# Patient Record
Sex: Male | Born: 1935
Health system: Southern US, Community
[De-identification: ages and names within clinical notes are randomized; demographics above are authoritative.]

## PROBLEM LIST (undated history)

## (undated) DIAGNOSIS — Z9889 Other specified postprocedural states: Secondary | ICD-10-CM

## (undated) DIAGNOSIS — K219 Gastro-esophageal reflux disease without esophagitis: Secondary | ICD-10-CM

## (undated) DIAGNOSIS — E119 Type 2 diabetes mellitus without complications: Secondary | ICD-10-CM

## (undated) DIAGNOSIS — E785 Hyperlipidemia, unspecified: Secondary | ICD-10-CM

## (undated) DIAGNOSIS — R011 Cardiac murmur, unspecified: Secondary | ICD-10-CM

## (undated) DIAGNOSIS — I10 Essential (primary) hypertension: Secondary | ICD-10-CM

## (undated) DIAGNOSIS — Z950 Presence of cardiac pacemaker: Secondary | ICD-10-CM

## (undated) DIAGNOSIS — Z8679 Personal history of other diseases of the circulatory system: Secondary | ICD-10-CM

## (undated) DIAGNOSIS — B009 Herpesviral infection, unspecified: Secondary | ICD-10-CM

## (undated) DIAGNOSIS — I442 Atrioventricular block, complete: Secondary | ICD-10-CM

## (undated) HISTORY — DX: Other specified postprocedural states: Z98.890

## (undated) HISTORY — DX: Herpesviral infection, unspecified: B00.9

## (undated) HISTORY — DX: Type 2 diabetes mellitus without complications: E11.9

## (undated) HISTORY — PX: APPENDECTOMY: SHX54

## (undated) HISTORY — DX: Hyperlipidemia, unspecified: E78.5

## (undated) HISTORY — DX: Personal history of other diseases of the circulatory system: Z86.79

## (undated) HISTORY — DX: Essential (primary) hypertension: I10

---

## 2001-07-09 ENCOUNTER — Encounter: Payer: Self-pay | Admitting: Emergency Medicine

## 2001-07-09 ENCOUNTER — Emergency Department (HOSPITAL_COMMUNITY): Admission: EM | Admit: 2001-07-09 | Discharge: 2001-07-09 | Payer: Self-pay | Admitting: Emergency Medicine

## 2001-07-27 ENCOUNTER — Encounter: Admission: RE | Admit: 2001-07-27 | Discharge: 2001-10-25 | Payer: Self-pay | Admitting: Internal Medicine

## 2002-07-31 ENCOUNTER — Emergency Department (HOSPITAL_COMMUNITY): Admission: EM | Admit: 2002-07-31 | Discharge: 2002-07-31 | Payer: Self-pay | Admitting: Emergency Medicine

## 2002-08-12 ENCOUNTER — Encounter: Payer: Self-pay | Admitting: Internal Medicine

## 2002-08-12 ENCOUNTER — Encounter: Admission: RE | Admit: 2002-08-12 | Discharge: 2002-08-12 | Payer: Self-pay | Admitting: Internal Medicine

## 2002-09-27 ENCOUNTER — Emergency Department (HOSPITAL_COMMUNITY): Admission: EM | Admit: 2002-09-27 | Discharge: 2002-09-28 | Payer: Self-pay | Admitting: Emergency Medicine

## 2004-07-09 ENCOUNTER — Emergency Department (HOSPITAL_COMMUNITY): Admission: EM | Admit: 2004-07-09 | Discharge: 2004-07-09 | Payer: Self-pay | Admitting: Emergency Medicine

## 2004-07-30 ENCOUNTER — Encounter: Admission: RE | Admit: 2004-07-30 | Discharge: 2004-10-28 | Payer: Self-pay | Admitting: Endocrinology

## 2006-10-14 ENCOUNTER — Encounter: Admission: RE | Admit: 2006-10-14 | Discharge: 2006-10-14 | Payer: Self-pay | Admitting: Cardiothoracic Surgery

## 2006-10-14 ENCOUNTER — Ambulatory Visit: Payer: Self-pay | Admitting: Cardiothoracic Surgery

## 2007-02-10 ENCOUNTER — Encounter: Payer: Self-pay | Admitting: Infectious Disease

## 2007-03-25 ENCOUNTER — Ambulatory Visit: Payer: Self-pay | Admitting: Infectious Disease

## 2007-03-25 DIAGNOSIS — B009 Herpesviral infection, unspecified: Secondary | ICD-10-CM | POA: Insufficient documentation

## 2007-03-25 LAB — CONVERTED CEMR LAB: GC Probe Amp, Urine: NEGATIVE

## 2007-11-06 ENCOUNTER — Ambulatory Visit: Payer: Self-pay | Admitting: Cardiothoracic Surgery

## 2007-11-06 ENCOUNTER — Encounter: Admission: RE | Admit: 2007-11-06 | Discharge: 2007-11-06 | Payer: Self-pay | Admitting: Cardiothoracic Surgery

## 2008-11-11 ENCOUNTER — Encounter: Admission: RE | Admit: 2008-11-11 | Discharge: 2008-11-11 | Payer: Self-pay | Admitting: Cardiothoracic Surgery

## 2008-11-11 ENCOUNTER — Ambulatory Visit: Payer: Self-pay | Admitting: Cardiothoracic Surgery

## 2008-12-20 ENCOUNTER — Ambulatory Visit (HOSPITAL_COMMUNITY): Admission: RE | Admit: 2008-12-20 | Discharge: 2008-12-20 | Payer: Self-pay | Admitting: *Deleted

## 2009-04-29 ENCOUNTER — Emergency Department (HOSPITAL_COMMUNITY): Admission: EM | Admit: 2009-04-29 | Discharge: 2009-04-29 | Payer: Self-pay | Admitting: Emergency Medicine

## 2009-06-09 ENCOUNTER — Encounter: Admission: RE | Admit: 2009-06-09 | Discharge: 2009-06-09 | Payer: Self-pay | Admitting: Endocrinology

## 2009-09-05 ENCOUNTER — Ambulatory Visit (HOSPITAL_COMMUNITY): Admission: RE | Admit: 2009-09-05 | Discharge: 2009-09-05 | Payer: Self-pay | Admitting: Gastroenterology

## 2009-11-23 ENCOUNTER — Emergency Department (HOSPITAL_COMMUNITY): Admission: EM | Admit: 2009-11-23 | Discharge: 2009-11-23 | Payer: Self-pay | Admitting: Emergency Medicine

## 2009-12-18 ENCOUNTER — Ambulatory Visit: Payer: Self-pay | Admitting: Surgery

## 2009-12-18 ENCOUNTER — Encounter (INDEPENDENT_AMBULATORY_CARE_PROVIDER_SITE_OTHER): Payer: Self-pay | Admitting: Emergency Medicine

## 2009-12-18 ENCOUNTER — Emergency Department (HOSPITAL_COMMUNITY): Admission: EM | Admit: 2009-12-18 | Discharge: 2009-12-18 | Payer: Self-pay | Admitting: Emergency Medicine

## 2010-05-09 ENCOUNTER — Ambulatory Visit: Payer: Self-pay | Admitting: Cardiothoracic Surgery

## 2010-05-09 ENCOUNTER — Encounter
Admission: RE | Admit: 2010-05-09 | Discharge: 2010-05-09 | Payer: Self-pay | Source: Home / Self Care | Attending: Cardiothoracic Surgery | Admitting: Cardiothoracic Surgery

## 2010-08-11 LAB — BASIC METABOLIC PANEL
BUN: 19 mg/dL (ref 6–23)
Creatinine, Ser: 1.47 mg/dL (ref 0.4–1.5)
GFR calc non Af Amer: 47 mL/min — ABNORMAL LOW (ref >60)

## 2010-08-11 LAB — CBC
MCHC: 33.9 g/dL (ref 30.0–36.0)
RDW: 15.9 % — ABNORMAL HIGH (ref 11.5–15.5)

## 2010-08-11 LAB — DIFFERENTIAL
Basophils Absolute: 0.1 10*3/uL (ref 0.0–0.1)
Basophils Relative: 1 % (ref 0–1)
Monocytes Absolute: 1 10*3/uL (ref 0.1–1.0)
Neutro Abs: 7 10*3/uL (ref 1.7–7.7)
Neutrophils Relative %: 73 % (ref 43–77)

## 2010-08-28 LAB — COMPREHENSIVE METABOLIC PANEL
CO2: 25 mEq/L (ref 19–32)
Calcium: 8.8 mg/dL (ref 8.4–10.5)
Creatinine, Ser: 1.47 mg/dL (ref 0.4–1.5)
GFR calc non Af Amer: 47 mL/min — ABNORMAL LOW (ref >60)
Glucose, Bld: 119 mg/dL — ABNORMAL HIGH (ref 70–99)
Total Protein: 6.1 g/dL (ref 6.0–8.3)

## 2010-08-28 LAB — CBC
Hemoglobin: 13.7 g/dL (ref 13.0–17.0)
Platelets: 167 10*3/uL (ref 150–400)

## 2010-08-28 LAB — DIFFERENTIAL
Basophils Absolute: 0 10*3/uL (ref 0.0–0.1)
Lymphocytes Relative: 23 % (ref 12–46)
Lymphs Abs: 1.3 10*3/uL (ref 0.7–4.0)
Neutro Abs: 3.8 10*3/uL (ref 1.7–7.7)

## 2010-08-28 LAB — LIPASE, BLOOD: Lipase: 21 U/L (ref 11–59)

## 2010-09-02 LAB — GLUCOSE, CAPILLARY: Glucose-Capillary: 90 mg/dL (ref 70–99)

## 2010-10-09 NOTE — Assessment & Plan Note (Signed)
OFFICE VISIT   Mercado, Travis A  DOB:  [birth year]                                        May 09, 2010  CHART #:  [id number]   CURRENT PROBLEMS:  1. 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  2. Hypertension.  3. Type 2 diabetes.   PRESENT ILLNESS:  The patient is a 75 year old obese African American  male, diabetic, who has been followed for mild fusiform aneurysm of the  ascending thoracic aorta since 2007.  He has hypertension, dyslipidemia,  and is a nonsmoker.  He denies any chest pains or back pain or symptoms  of CHF.  He has a very soft grade 1/6 systolic murmur, which has also  been stable, for which he has not been able to obtain a 2-D echo.  There  is no family history of significant thoracic or abdominal aortic  aneurysm disease.  He remains obese with a weight of 221 pounds, which  is stable.   CURRENT MEDICATIONS:  1. Benicar 40 mg p.o. daily.  2. Actos 40 mg daily.  3. Atenolol 25 mg daily.  4. Simvastatin 40 mg daily.  5. Vitamin D.   He is currently taking oral prednisone prescribed by a dermatologist for  a variant of psoriasis.  He states he stopped taking 81 aspirin a day  due to some indigestion.   PHYSICAL EXAMINATION:  Vital Signs:  Blood pressure 134/80, pulse 60,  respirations 16, and oxygen saturation 97%.  Weight 221 pounds by the  patient's report.  General Appearance:  A pleasant 75 year old male, in  no distress.  He is obese.  Lungs:  Breath sounds are clear.  He has no  carotid bruit.  Cardiac:  Rhythm is regular.  He has a very soft grade  1/6 systolic murmur consistent with aortic sclerosis.  Abdomen:  He has  no pulsatile mass in the abdomen.  Extremities:  Peripheral pulses are  intact in all extremities.   LABORATORY DATA:  An MRA of his thoracic aorta was performed instead of  a CT scan in order to reduce long-term exposure to radiation.  This  shows  a stable mild fusiform aneurysm of the ascending aorta without  change, measuring by MRA of 4.5 cm.  This represents low risk disease  and surgical reconstruction cannot be recommended unless became in the  5.5 range or shows rapid increase over time.   PLAN:  I would recommend a followup screening MRA of his thoracic aorta  in 1 year.  He has been unable to get a 2-D echo for a slight murmur,  but we will attempt to arrange this at the hospital when he returns for  his followup MRA next year.   Travis Mercado, M.D.  Electronically Signed   PV/MEDQ  D:  05/09/2010  T:  05/09/2010  Job:  [id number]   cc:   Travis Mercado, M.D.

## 2010-10-09 NOTE — Assessment & Plan Note (Signed)
OFFICE VISIT   [name], Jadyn A  DOB:  08/30/1935                                        November 07, 2007  CHART #:  [id number]   CURRENT PROBLEMS:  1. Fusiform ascending aortic aneurysm measuring 4.3 cm, initial      diagnosis July 2007.  2. Murmur of aortic stenosis or bicuspid aortic valve.  3. Hypertension.  4. Type 2 diabetes mellitus.  5. Hyperlipidemia.   HISTORY OF PRESENT ILLNESS:  The patient returns for his annual  evaluation of his thoracic CT angiogram to follow the fusiform aneurysm  of his ascending aorta which was initially noted in 2007.  He was last  evaluated 1 year ago, and the CT scan today shows the aneurysm remains  stable at 4.3 cm.  He has had no associated chest pains or shortness of  breath or decrease in exercise tolerance.  He still does aerobic  exercise 2-3 days a week at the [facility].  He does not smoke and  he is followed by Dr. Juleen China.  He remains on atenolol 50 mg, aspirin 81  mg, Actos 45 mg, Benicar 40 mg, Zocor 40 mg daily.  On his CT scan  today, his arch and descending thoracic aorta remain normal at  approximately 3 cm in diameter.  He has no pulmonary or parenchymal  masses noted on his lung windows.   PHYSICAL EXAMINATION:  Blood pressure 150/90, pulse 50, respirations 20,  saturation 98%.  His weight 229 pounds.  He is alert and pleasant.  Breath sounds are clear and equal.  He has a grade 2/6 systolic ejection  murmur at the right upper sternal border.  Peripheral pulses are intact.   IMPRESSION AND PLAN:  The patient has a mild stable fusiform ascending  aneurysm and probably a bicuspid aortic valve with aortic sclerosis or  mild stenosis.  He states he has not had a 2-D echo in several years and  we will arrange for an evaluation with echo by Dr. Aggie Cosier at  [facility].  I discussed with the patient that  intervention on his fusiform aneurysm would not be indicated until it  becomes  in the 5-5.5 cm range.  However, I do think it would be prudent  to proceed with a 2-D echo to evaluate more accurately his aortic valve.  Otherwise, return to see me in 1 year with a followup CT angiogram of  the chest.   Kerin Perna, M.D.  Electronically Signed   PV/MEDQ  D:  11/07/2007  T:  11/07/2007  Job:  [id number]   cc:   Brooke Bonito, M.D.

## 2010-10-09 NOTE — Assessment & Plan Note (Signed)
OFFICE VISIT   [name], Greogory A  DOB:  07-13-1935                                        November 11, 2008  CHART #:  [id number]   CURRENT PROBLEMS:  1. Fusiform aneurysm of the ascending aorta, 4.3 cm, followed since      July 2007.  2. Mild aortic valve disease.  3. Hypertension.  4. Type 2 diabetes.   PRESENT ILLNESS:  Mr. Travis Mercado is a 75 year old gentleman who returns for  his annual CT scan in follow-up of a mild dilatation of the same  thoracic aorta measuring 4.3 cm since 2007.  He is retired but is  exercising in the [employer] 2-3 days a week.  He does not smoke and is  followed by Dr. Juleen China.  He remains on atenolol 50 mg, aspirin 81 mg,  Actos, Benicar, and Zocor.  He has had no chest or upper back pain, and  he has not been hospitalized since his visit last summer, June of 2009.  A 2-D echocardiogram was recommended last year for mild murmur aortic  stenosis but has not been completed, so this will be ordered for the  next interval.   PHYSICAL EXAMINATION:  Blood pressure 160/90, pulse 55, respirations 18,  saturation 96%.  He is alert and comfortable.  Breath sounds are clear  and equal.  Cardiac rhythm is regular without S3 gallop.  There is a  mild systolic murmur, grade 1-2/6.  Peripheral pulses are intact, and  his weight is 232 pounds - stable from 2 years ago.   I reviewed the CT angiogram with the patient, and the transverse  diameter of his ascending aorta has been stable since 2007.   We will plan on continuing to follow, although I believe the patient is  at low risk.  Rather than subjecting him to serial doses of rather high  intensity radiation with CT scans, we will perform  the screening with MRI angiograms to avoid radiation exposure.  His next  exam and follow-up will be in approximately 1 year.   Kerin Perna, M.D.  Electronically Signed   PV/MEDQ  D:  11/11/2008  T:  11/11/2008  Job:  [id number]   cc:   Brooke Bonito, M.D.

## 2010-10-09 NOTE — Op Note (Signed)
NAME:  Travis Mercado, Plez                  ACCOUNT NO.:  [id number]   MEDICAL RECORD NO.:  [id number]          PATIENT TYPE:  AMB   LOCATION:  ENDO                         FACILITY:  [facility]   PHYSICIAN:  Georgiana Spinner, M.D.    DATE OF BIRTH:  02/27/1936   DATE OF PROCEDURE:  DATE OF DISCHARGE:                               OPERATIVE REPORT   PROCEDURE:  Colonoscopy.   INDICATIONS:  Colon cancer screening.  The patient with new onset of  constipation symptoms.   ANESTHESIA:  Fentanyl 120 mcg, Versed 12 mg.   PROCEDURE:  With the patient mildly sedated in the left lateral  decubitus position a rectal exam was performed which was unremarkable.  The prostate was not enlarged.  Subsequently, the Pentax videoscopic  pediatric colonoscope was inserted in the rectum and passed under direct  vision through a tortuous sigmoid colon to reach the cecum, identified  by ileocecal valve and appendiceal orifice both of which were  photographed.  From this point, the colonoscope was slowly withdrawn  taking circumferential views of the colonic mucosa stopping only in the  rectum which appeared normal on direct and showed hemorrhoids on  retroflexed view.  The endoscope was straightened and withdrawn.  The  patient's vital signs and pulse oximeter remained stable.  The patient  tolerated the procedure well without apparent complications.   FINDINGS:  Some diverticulosis of sigmoid colon.  Internal hemorrhoids  otherwise, an unremarkable exam.   PLAN:  Suggest dietary changes for his constipation with more fiber,  etc., and have the patient follow-up with primary care doctor as needed.  Repeat examination in 5-10 years as indicated.           ______________________________  Georgiana Spinner, M.D.     GMO/MEDQ  D:  12/20/2008  T:  12/20/2008  Job:  [id number]

## 2011-01-02 ENCOUNTER — Other Ambulatory Visit: Payer: Self-pay | Admitting: Gastroenterology

## 2011-01-02 DIAGNOSIS — R110 Nausea: Secondary | ICD-10-CM

## 2011-01-07 ENCOUNTER — Ambulatory Visit
Admission: RE | Admit: 2011-01-07 | Discharge: 2011-01-07 | Disposition: A | Discharge status: Home / Self Care | Payer: Medicare Other | Source: Ambulatory Visit | Attending: Gastroenterology | Admitting: Gastroenterology

## 2011-01-07 DIAGNOSIS — R110 Nausea: Secondary | ICD-10-CM

## 2011-04-11 ENCOUNTER — Other Ambulatory Visit: Payer: Self-pay | Admitting: Cardiothoracic Surgery

## 2011-04-11 DIAGNOSIS — I712 Thoracic aortic aneurysm, without rupture: Secondary | ICD-10-CM

## 2011-06-04 ENCOUNTER — Ambulatory Visit
Admission: RE | Admit: 2011-06-04 | Discharge: 2011-06-04 | Disposition: A | Discharge status: Home / Self Care | Payer: Medicare Other | Source: Ambulatory Visit | Attending: Cardiothoracic Surgery | Admitting: Cardiothoracic Surgery

## 2011-06-04 DIAGNOSIS — I712 Thoracic aortic aneurysm, without rupture: Secondary | ICD-10-CM

## 2011-06-04 MED ORDER — GADOBENATE DIMEGLUMINE 529 MG/ML IV SOLN
20.0000 mL | Freq: Once | INTRAVENOUS | Status: AC | PRN
  Administered 2011-06-04: 20 mL via INTRAVENOUS

## 2011-06-05 ENCOUNTER — Ambulatory Visit (INDEPENDENT_AMBULATORY_CARE_PROVIDER_SITE_OTHER): Payer: Medicare Other | Admitting: Cardiothoracic Surgery

## 2011-06-05 ENCOUNTER — Other Ambulatory Visit: Payer: Self-pay

## 2011-06-05 VITALS — BP 122/76 | HR 49 | Resp 18 | Ht 66.0 in | Wt 214.0 lb

## 2011-06-05 DIAGNOSIS — E119 Type 2 diabetes mellitus without complications: Secondary | ICD-10-CM | POA: Insufficient documentation

## 2011-06-05 DIAGNOSIS — I10 Essential (primary) hypertension: Secondary | ICD-10-CM

## 2011-06-05 DIAGNOSIS — E785 Hyperlipidemia, unspecified: Secondary | ICD-10-CM | POA: Insufficient documentation

## 2011-06-05 DIAGNOSIS — I712 Thoracic aortic aneurysm, without rupture: Secondary | ICD-10-CM | POA: Insufficient documentation

## 2011-06-05 HISTORY — DX: Hyperlipidemia, unspecified: E78.5

## 2011-06-05 NOTE — Progress Notes (Signed)
HPI                          [address]            [place] [address]          [phone]     The patient is a 76 year old [place] male returns for an annual followup of a mild fusiform ascending thoracic aortic aneurysm. It is asymptomatic. It measured 4.4 cm and is been followed since 2007. He has hypertension but is on a beta blocker and anARB. He denies chest pain upper back pain or shortness of breath. He works at [employer] with light lifting and swimming 3-4 times a week. No change in exercise tolerance since his last exam.   Current Outpatient Prescriptions  Medication Sig Dispense Refill  . atenolol (TENORMIN) 25 MG tablet Take 25 mg by mouth daily.      . cholecalciferol (VITAMIN D) 1000 UNITS tablet Take 1,000 Units by mouth daily.      Marland Kitchen olmesartan (BENICAR) 40 MG tablet Take 40 mg by mouth daily.      . simvastatin (ZOCOR) 40 MG tablet Take 40 mg by mouth every evening.       No current facility-administered medications for this visit.   Facility-Administered Medications Ordered in Other Visits  Medication Dose Route Frequency Provider Last Rate Last Dose  . gadobenate dimeglumine (MULTIHANCE) injection 20 mL  20 mL Intravenous Once PRN Medication Radiologist   20 mL at 06/04/11 1148     Review of Systems: No fever weight loss no ankle swelling no orthopnea. He is been evaluated by Dr. Carman Ching for nausea and has had a endoscopy ultrasound and a gastric emptying study. He has stopped taking his aspirin for a few months the to his ongoing nausea evaluation. He denies nausea with exertion.  Physical Exam  vital signs blood pressure 122/76 pulse 60 saturation 99% on room air weight 5 foot 6 weight 214 pounds BMI 34.5 General appearance is that of a healthy overweight Afro-American male no distress Neck without JVD or carotid bruit or adenopathy Cardiac exam with a soft 1/6 systolic ejection murmur left lower sternal border no gallop Neuro  intact    Diagnostic Tests:The patient completed a MRI-MRA of the thoracic aorta to compare with previous exams. The ascending thoracic aneurysm remains unchanged it 4.3 cm by MR a. The descending thoracic aorta is 2.7 cm. Is no evidence of false lumen or hematoma.  Impression:Stable mild fusiform ascending thoracic aneurysm no change   Plan:Return in one year for followup MR I.-MRA of the thoracic aorta to monitor ascending aorta.

## 2011-10-16 ENCOUNTER — Emergency Department (HOSPITAL_COMMUNITY)
Admission: EM | Admit: 2011-10-16 | Discharge: 2011-10-17 | Disposition: A | Discharge status: Home / Self Care | Payer: Medicare Other | Attending: Emergency Medicine | Admitting: Emergency Medicine

## 2011-10-16 ENCOUNTER — Emergency Department (HOSPITAL_COMMUNITY): Payer: Medicare Other

## 2011-10-16 ENCOUNTER — Encounter (HOSPITAL_COMMUNITY): Payer: Self-pay | Admitting: Emergency Medicine

## 2011-10-16 DIAGNOSIS — I10 Essential (primary) hypertension: Secondary | ICD-10-CM | POA: Insufficient documentation

## 2011-10-16 DIAGNOSIS — L02219 Cutaneous abscess of trunk, unspecified: Secondary | ICD-10-CM | POA: Insufficient documentation

## 2011-10-16 DIAGNOSIS — R911 Solitary pulmonary nodule: Secondary | ICD-10-CM | POA: Insufficient documentation

## 2011-10-16 DIAGNOSIS — N200 Calculus of kidney: Secondary | ICD-10-CM | POA: Insufficient documentation

## 2011-10-16 DIAGNOSIS — E119 Type 2 diabetes mellitus without complications: Secondary | ICD-10-CM | POA: Insufficient documentation

## 2011-10-16 DIAGNOSIS — R109 Unspecified abdominal pain: Secondary | ICD-10-CM | POA: Insufficient documentation

## 2011-10-16 DIAGNOSIS — L03311 Cellulitis of abdominal wall: Secondary | ICD-10-CM

## 2011-10-16 LAB — BASIC METABOLIC PANEL
CO2: 25 mEq/L (ref 19–32)
Chloride: 105 mEq/L (ref 96–112)
GFR calc non Af Amer: 46 mL/min — ABNORMAL LOW (ref >90)
Glucose, Bld: 110 mg/dL — ABNORMAL HIGH (ref 70–99)
Potassium: 4.1 mEq/L (ref 3.5–5.1)
Sodium: 140 mEq/L (ref 135–145)

## 2011-10-16 LAB — DIFFERENTIAL
Eosinophils Absolute: 0.3 10*3/uL (ref 0.0–0.7)
Lymphocytes Relative: 33 % (ref 12–46)
Lymphs Abs: 2.9 10*3/uL (ref 0.7–4.0)
Neutro Abs: 4.9 10*3/uL (ref 1.7–7.7)
Neutrophils Relative %: 55 % (ref 43–77)

## 2011-10-16 LAB — CBC
Platelets: 165 10*3/uL (ref 150–400)
RBC: 5.35 MIL/uL (ref 4.22–5.81)
WBC: 8.8 10*3/uL (ref 4.0–10.5)

## 2011-10-16 MED ORDER — VANCOMYCIN HCL IN DEXTROSE 1-5 GM/200ML-% IV SOLN
1000.0000 mg | Freq: Once | INTRAVENOUS | Status: AC
  Administered 2011-10-16: 1000 mg via INTRAVENOUS
  Filled 2011-10-16: qty 200

## 2011-10-16 MED ORDER — IOHEXOL 300 MG/ML  SOLN
80.0000 mL | Freq: Once | INTRAMUSCULAR | Status: AC | PRN
  Administered 2011-10-16: 80 mL via INTRAVENOUS

## 2011-10-16 MED ORDER — SULFAMETHOXAZOLE-TRIMETHOPRIM 800-160 MG PO TABS: 1.0000 | ORAL_TABLET | Freq: Two times a day (BID) | ORAL | Status: AC

## 2011-10-16 MED ORDER — SODIUM CHLORIDE 0.9 % IV SOLN
Freq: Once | INTRAVENOUS | Status: AC
  Administered 2011-10-16: 1000 mL via INTRAVENOUS

## 2011-10-16 MED ORDER — CEPHALEXIN 500 MG PO CAPS: 500.0000 mg | ORAL_CAPSULE | Freq: Four times a day (QID) | ORAL | Status: AC

## 2011-10-16 NOTE — Discharge Instructions (Signed)
FOLLOW UP WITH DR. Juleen China IN 2 DAYS FOR RECHECK OF SKIN INFECTION. TAKE ANTIBIOTICS AS PRESCRIBED. RETURN HERE WITH ANY SEVERE PAIN, HIGH FEVER OR NEW CONCERN.   Cellulitis Cellulitis is an infection of the skin and the tissue beneath it. The area is typically red and tender. It is caused by germs (bacteria) (usually staph or strep) that enter the body through cuts or sores. Cellulitis most commonly occurs in the arms or lower legs.  HOME CARE INSTRUCTIONS   If you are given a prescription for medications which kill germs (antibiotics), take as directed until finished.   If the infection is on the arm or leg, keep the limb elevated as able.   Use a warm cloth several times per day to relieve pain and encourage healing.   See your caregiver for recheck of the infected site as directed if problems arise.   Only take over-the-counter or prescription medicines for pain, discomfort, or fever as directed by your caregiver.  SEEK MEDICAL CARE IF:   The area of redness (inflammation) is spreading, there are red streaks coming from the infected site, or if a part of the infection begins to turn dark in color.   The joint or bone underneath the infected skin becomes painful after the skin has healed.   The infection returns in the same or another area after it seems to have gone away.   A boil or bump swells up. This may be an abscess.   New, unexplained problems such as pain or fever develop.  SEEK IMMEDIATE MEDICAL CARE IF:   You have a fever.   You or your child feels drowsy or lethargic.   There is vomiting, diarrhea, or lasting discomfort or feeling ill (malaise) with muscle aches and pains.  MAKE SURE YOU:   Understand these instructions.   Will watch your condition.   Will get help right away if you are not doing well or get worse.  Document Released: 02/20/2005 Document Revised: 05/02/2011 Document Reviewed: 12/30/2007 [facility] Patient Information 2012 [place], [place].

## 2011-10-16 NOTE — ED Provider Notes (Signed)
Medical screening examination/treatment/procedure(s) were conducted as a shared visit with non-physician practitioner(s) and myself.  I personally evaluated the patient during the encounter   Ashe Shyah Cadmus, MD 10/16/11 2319 

## 2011-10-16 NOTE — ED Provider Notes (Signed)
History     CSN: [id number]  Arrival date & time 10/16/11  4098   First MD Initiated Contact with Patient 10/16/11 2100      Chief Complaint  Patient presents with  . Abdominal Pain    (Consider location/radiation/quality/duration/timing/severity/associated sxs/prior treatment) Patient is a 76 y.o. male presenting with abdominal pain. The history is provided by the patient.  Abdominal Pain The primary symptoms of the illness include abdominal pain. The primary symptoms of the illness do not include fever, nausea or vomiting.  Associated symptoms comments: He complains of pain, redness and drainage from his umbilicus since yesterday. No fever, nausea. He reports that the drainage is purulent and is increased when he pushes on the surrounding area. . Significant associated medical issues include diabetes.    Past Medical History  Diagnosis Date  . 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 DM2 (diabetes mellitus, type 2)   . HTN (hypertension)   . HSV infection   . Hyperlipidemia 06/05/2011    History reviewed. No pertinent past surgical history.  History reviewed. No pertinent family history.  History  Substance Use Topics  . Smoking status: Former Smoker    Types: Cigarettes    Quit date: 05/28/1983  . Smokeless tobacco: Never Used  . Alcohol Use: No      Review of Systems  Constitutional: Negative for fever.  Gastrointestinal: Positive for abdominal pain. Negative for nausea and vomiting.  Skin:       C/O drainage from umbilicus - see HPI.    Allergies  Review of patient's allergies indicates no known allergies.  Home Medications   Current Outpatient Rx  Name Route Sig Dispense Refill  . ATENOLOL 25 MG PO TABS Oral Take 25 mg by mouth daily.    Marland Kitchen VITAMIN D 1000 UNITS PO TABS Oral Take 1,000 Units by mouth daily.    Marland Kitchen OLMESARTAN MEDOXOMIL 40 MG PO TABS Oral Take 40 mg by mouth daily.    Marland Kitchen OMEPRAZOLE 20 MG PO CPDR Oral Take 20  mg by mouth daily.    Marland Kitchen SIMVASTATIN 40 MG PO TABS Oral Take 40 mg by mouth every evening.      BP 142/84  Pulse 56  Temp(Src) 98.2 F (36.8 C) (Oral)  Resp 16  SpO2 100%  Physical Exam  Constitutional: He appears well-developed and well-nourished.  Pulmonary/Chest: Effort normal.  Abdominal: Soft. Bowel sounds are normal.       Purulent drainage from umbilicus active, can be expressed with pressure to surrounding area, but is nontender. Redness extends to an area approximately 9cm x 6 cm.   Neurological: He is alert.  Skin: Skin is warm and dry. There is erythema.  Psychiatric: He has a normal mood and affect.    ED Course  Procedures (including critical care time)  Labs Reviewed  CBC - Abnormal; Notable for the following:    MCV 73.6 (*)    All other components within normal limits  BASIC METABOLIC PANEL - Abnormal; Notable for the following:    Glucose, Bld 110 (*)    Creatinine, Ser 1.45 (*)    GFR calc non Af Amer 46 (*)    GFR calc Af Amer 54 (*)    All other components within normal limits  DIFFERENTIAL   Results for orders placed during the hospital encounter of 10/16/11  CBC      Component Value Range   WBC 8.8  4.0 - 10.5 (K/uL)  RBC 5.35  4.22 - 5.81 (MIL/uL)   Hemoglobin 14.0  13.0 - 17.0 (g/dL)   HCT 96.0  45.4 - 09.8 (%)   MCV 73.6 (*) 78.0 - 100.0 (fL)   MCH 26.2  26.0 - 34.0 (pg)   MCHC 35.5  30.0 - 36.0 (g/dL)   RDW 11.9  14.7 - 82.9 (%)   Platelets 165  150 - 400 (K/uL)  DIFFERENTIAL      Component Value Range   Neutrophils Relative 55  43 - 77 (%)   Neutro Abs 4.9  1.7 - 7.7 (K/uL)   Lymphocytes Relative 33  12 - 46 (%)   Lymphs Abs 2.9  0.7 - 4.0 (K/uL)   Monocytes Relative 8  3 - 12 (%)   Monocytes Absolute 0.7  0.1 - 1.0 (K/uL)   Eosinophils Relative 4  0 - 5 (%)   Eosinophils Absolute 0.3  0.0 - 0.7 (K/uL)   Basophils Relative 1  0 - 1 (%)   Basophils Absolute 0.1  0.0 - 0.1 (K/uL)  BASIC METABOLIC PANEL      Component Value Range    Sodium 140  135 - 145 (mEq/L)   Potassium 4.1  3.5 - 5.1 (mEq/L)   Chloride 105  96 - 112 (mEq/L)   CO2 25  19 - 32 (mEq/L)   Glucose, Bld 110 (*) 70 - 99 (mg/dL)   BUN 20  6 - 23 (mg/dL)   Creatinine, Ser 5.62 (*) 0.50 - 1.35 (mg/dL)   Calcium 9.2  8.4 - 13.0 (mg/dL)   GFR calc non Af Amer 46 (*) >90 (mL/min)   GFR calc Af Amer 54 (*) >90 (mL/min)  Ct Abdomen Pelvis W Contrast  10/16/2011  *RADIOLOGY REPORT*  Clinical Data: Abdominal pain  CT ABDOMEN AND PELVIS WITH CONTRAST  Technique:  Multidetector CT imaging of the abdomen and pelvis was performed following the standard protocol during bolus administration of intravenous contrast.  Contrast: 80mL OMNIPAQUE IOHEXOL 300 MG/ML  SOLN  Comparison: None.  Findings: 4 mm right middle lobe nodule.  Normal heart size. Coronary artery calcification.  Unremarkable liver, biliary system, spleen, pancreas, adrenal glands.  Nonobstructing right upper pole stone.  Mild bilateral perinephric fat stranding.  No hydronephrosis or hydroureter. Duplicated collecting system on the right.  No bowel obstruction.  Mild colonic diverticulosis.  No CT evidence for diverticulitis.  Appendix not identified however no right lower quadrant inflammation.  No free intraperitoneal air or fluid.  No lymphadenopathy.  There is scattered atherosclerotic calcification of the aorta and its branches. No aneurysmal dilatation.  Subcutaneous fat stranding in the periumbilical distribution.  No loculated fluid collection or intraperitoneal extension.  Thin-walled bladder. No acute osseous finding.  IMPRESSION: Periumbilical fat stranding may reflect cellulitis.  No associated fluid collection or intraperitoneal extension.  Nonobstructing right renal stone.  Mild perinephric fat stranding is nonspecific.  Correlate with urinalysis if concerned for infection.  4 mm nodule within the right middle lobe. If the patient is at high risk for bronchogenic carcinoma, follow-up chest CT at 1 year is  recommended.  If the patient is at low risk, no follow-up is needed.  This recommendation follows the consensus statement: Guidelines for Management of Small Pulmonary Nodules Detected on CT Scans:  A Statement from the Fleischner Society as published in Radiology 2005; 237:395-400.  Original Report Authenticated By: Waneta Martins, M.D.    No results found.   No diagnosis found. 1. Cellulitis abdominal wall  MDM  Discussed  IV CM for CT in evaluation of abscess and, per CT personnel, he can have contrast with existing Creatinine of 1. 47. CT showing cellulitis without gross or deep abscess. No leukocytosis, no tenderness on palpation, no fever. Feel he can be discharged home with close follow up.        Rodena Medin, PA-C 10/16/11 2252

## 2011-10-16 NOTE — ED Notes (Signed)
Pt c/o redness and swelling to umbilical area since Saturday.  No abdominal pain, some pus-like drainage from umbilicus.  Denies n/v, fever, chills, diarrhea, constipation, abdominal swelling or tenderness.

## 2011-10-16 NOTE — ED Notes (Signed)
Patient complaining of abdominal pain that started yesterday around navel.  Patient reports pus-like bloody drainage draining from navel.  Patient states that when he pressed on his stomach, the fluid would come out and help relieve the pain.  Patient states that he has had drainage from this area a year ago and it stopped on its own. Denies nausea, vomiting, and diarrhea.

## 2011-10-19 ENCOUNTER — Encounter (HOSPITAL_COMMUNITY): Payer: Self-pay

## 2011-10-19 ENCOUNTER — Emergency Department (HOSPITAL_COMMUNITY)
Admission: EM | Admit: 2011-10-19 | Discharge: 2011-10-19 | Disposition: A | Discharge status: Home / Self Care | Payer: Medicare Other | Attending: Emergency Medicine | Admitting: Emergency Medicine

## 2011-10-19 DIAGNOSIS — E119 Type 2 diabetes mellitus without complications: Secondary | ICD-10-CM | POA: Insufficient documentation

## 2011-10-19 DIAGNOSIS — L03316 Cellulitis of umbilicus: Secondary | ICD-10-CM

## 2011-10-19 DIAGNOSIS — I10 Essential (primary) hypertension: Secondary | ICD-10-CM | POA: Insufficient documentation

## 2011-10-19 DIAGNOSIS — L02219 Cutaneous abscess of trunk, unspecified: Secondary | ICD-10-CM | POA: Insufficient documentation

## 2011-10-19 DIAGNOSIS — E785 Hyperlipidemia, unspecified: Secondary | ICD-10-CM | POA: Insufficient documentation

## 2011-10-19 DIAGNOSIS — Z87891 Personal history of nicotine dependence: Secondary | ICD-10-CM | POA: Insufficient documentation

## 2011-10-19 NOTE — ED Provider Notes (Signed)
Medical screening examination/treatment/procedure(s) were conducted as a shared visit with non-physician practitioner(s) and myself.  I personally evaluated the patient during the encounter Pt seen 2 days ago with cellulitis in the umbilical region, Rx with TMP/SMZ and Keflex.  Exam shows he is doing well, with near resolution of cellulitis and minimal drainage from the umbilicus.  Advised to continue his antibiotics, see Dr. Juleen China, his PCP, in the office in 2 days for followup.   Carleene Cooper III, MD 10/19/11 2028

## 2011-10-19 NOTE — ED Provider Notes (Signed)
History     CSN: [id number]  Arrival date & time 10/19/11  2536   First MD Initiated Contact with Patient 10/19/11 [phone]      Chief Complaint  Patient presents with  . Follow-up    (Consider location/radiation/quality/duration/timing/severity/associated sxs/prior treatment) HPI Patient is here for reevaluation of cellulitis in the umbilical region. the patient states the area appears to be improving and he has less discomfort and swelling to the area. patient states than taking both antibiotics as directed. patient states that he has not had any fever, nausea, vomiting, or abdominal pain  Past Medical History  Diagnosis Date  . 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 DM2 (diabetes mellitus, type 2)   . HTN (hypertension)   . HSV infection   . Hyperlipidemia 06/05/2011    History reviewed. No pertinent past surgical history.  No family history on file.  History  Substance Use Topics  . Smoking status: Former Smoker    Types: Cigarettes    Quit date: 05/28/1983  . Smokeless tobacco: Never Used  . Alcohol Use: No      Review of Systems All other systems negative except as documented in the HPI. All pertinent positives and negatives as reviewed in the HPI.  Allergies  Review of patient's allergies indicates no known allergies.  Home Medications   Current Outpatient Rx  Name Route Sig Dispense Refill  . ATENOLOL 25 MG PO TABS Oral Take 25 mg by  mouth daily.    . CEPHALEXIN 500 MG PO CAPS Oral Take 1 capsule (500 mg total) by mouth 4 (four) times daily. 40 capsule 0  . VITAMIN D 1000 UNITS PO TABS Oral Take 1,000 Units by mouth daily.    Marland Kitchen OLMESARTAN MEDOXOMIL 40 MG PO TABS Oral Take 40 mg by mouth daily.    Marland Kitchen OMEPRAZOLE 20 MG PO CPDR Oral Take 20 mg by mouth daily.    Marland Kitchen SIMVASTATIN 40 MG PO TABS Oral Take 40 mg by mouth daily.     . SULFAMETHOXAZOLE-TRIMETHOPRIM 800-160 MG PO TABS Oral Take 1 tablet by mouth every 12 (twelve) hours. 20 tablet 0    BP 133/86  Pulse 57  Temp(Src) 99.2 F (37.3 C) (Oral)  Resp 18  SpO2 96%  Physical Exam  Constitutional: He appears well-developed and well-nourished. No distress.  Abdominal:      ED Course  Procedures (including critical care time)  Patient is advised to return here for any worsening in his condition.  I also advised him to use warm soapy water to clean the area well.  Advised him to use heat around the area.  Told to followup with his primary care doctor on Monday for further evaluation and recheck. The patient has been seen by the attending Physician.   MDM  MDM Reviewed: nursing note, vitals and previous chart Reviewed previous: labs and CT scan            Carlyle Dolly, PA-C 10/19/11 [phone]

## 2011-10-19 NOTE — ED Notes (Signed)
Pt presents today for a follow up from his visit on 05/22.  Pt instructed to return for check of umbilicus.

## 2011-10-19 NOTE — Discharge Instructions (Signed)
Clean the area with warm soapy water 3 times a day. Use heat around the area.  Followup with her doctor on Monday for recheck.  Return here in the interim for any worsening in your condition

## 2012-02-05 ENCOUNTER — Other Ambulatory Visit: Payer: Self-pay | Admitting: Gastroenterology

## 2012-02-05 DIAGNOSIS — K219 Gastro-esophageal reflux disease without esophagitis: Secondary | ICD-10-CM

## 2012-02-12 ENCOUNTER — Ambulatory Visit
Admission: RE | Admit: 2012-02-12 | Discharge: 2012-02-12 | Disposition: A | Discharge status: Home / Self Care | Payer: Medicare Other | Source: Ambulatory Visit | Attending: Gastroenterology | Admitting: Gastroenterology

## 2012-02-12 DIAGNOSIS — K219 Gastro-esophageal reflux disease without esophagitis: Secondary | ICD-10-CM

## 2012-05-18 ENCOUNTER — Other Ambulatory Visit: Payer: Self-pay | Admitting: *Deleted

## 2012-05-18 DIAGNOSIS — I712 Thoracic aortic aneurysm, without rupture: Secondary | ICD-10-CM

## 2012-06-17 ENCOUNTER — Encounter: Payer: Self-pay | Admitting: Cardiothoracic Surgery

## 2012-06-17 ENCOUNTER — Ambulatory Visit (INDEPENDENT_AMBULATORY_CARE_PROVIDER_SITE_OTHER): Payer: Medicare Other | Admitting: Cardiothoracic Surgery

## 2012-06-17 ENCOUNTER — Ambulatory Visit
Admission: RE | Admit: 2012-06-17 | Discharge: 2012-06-17 | Disposition: A | Discharge status: Home / Self Care | Payer: Medicare Other | Source: Ambulatory Visit | Attending: Cardiothoracic Surgery | Admitting: Cardiothoracic Surgery

## 2012-06-17 VITALS — BP 146/85 | HR 58 | Resp 20 | Ht 66.0 in | Wt 203.0 lb

## 2012-06-17 DIAGNOSIS — I712 Thoracic aortic aneurysm, without rupture: Secondary | ICD-10-CM

## 2012-06-17 MED ORDER — GADOBENATE DIMEGLUMINE 529 MG/ML IV SOLN
20.0000 mL | Freq: Once | INTRAVENOUS | Status: AC | PRN
  Administered 2012-06-17: 20 mL via INTRAVENOUS

## 2012-06-17 NOTE — Progress Notes (Signed)
PCP is Michiel Sites, MD Referring Provider is Darci Needle, MD  Chief Complaint  Patient presents with  . Thoracic Aortic Aneurysm    1 year f/u MRA Chest surveillance of ascending thoracic aneurysm    HPI:   Past Medical History   Date  . Mild ascending fusiform aortic aneurysm since 2009   . DM2 (diabetes mellitus, type 2)   . HTN (hypertension)   . HSV infection   . Hyperlipidemia 06/05/2011    No past surgical history on file.  No family history on file.  Social History History  Substance Use Topics  . Smoking status: Former Smoker    Types: Cigarettes    Quit date: 05/28/1983  . Smokeless tobacco: Never Used  . Alcohol Use: No    Current Outpatient Prescriptions  Medication Sig Dispense Refill  . atenolol (TENORMIN) 25 MG tablet Take 25 mg by mouth daily.      . cholecalciferol (VITAMIN D) 1000 UNITS tablet Take 1,000 Units by mouth daily.      Marland Kitchen ezetimibe-simvastatin (VYTORIN) 10-40 MG per tablet Take 1 tablet by mouth at bedtime.      . hydrOXYzine (ATARAX/VISTARIL) 25 MG tablet Take 25 mg by mouth 3 (three) times daily as needed.      . mycophenolate (CELLCEPT) 500 MG tablet Take 500 mg by mouth 2 (two) times daily.      Marland Kitchen olmesartan (BENICAR) 40 MG tablet Take 40 mg by mouth daily.      . pantoprazole (PROTONIX) 40 MG tablet Take 40 mg by mouth daily.        No Known Allergies  Review of Systems he continues to swim and do aerobic exercise at the [employer] [place] limiting weights to less than 20 pounds he denies chest pain or shortness of breath his weight has been stable  BP 146/85  Pulse 58  Resp 20  Ht 5\' 6"  (1.676 m)  Wt 203 lb (92.08 kg)  BMI 32.76 kg/m2  SpO2 98% Physical Exam Alert and comfortable HEENT normocephalic Neck without JVD or carotid bruit Lungs clear Cardiac rhythm regular with a soft 1/6 systolic murmur, stable Abdomen obese but without palpable pulsatile mass Extremities nontender without edema  Diagnostic Tests: Thoracic  MRI shows the ascending fusiform dilatation  to be stable at approximately 4.5 cm, no change  Impression: Mild to moderate ascending fusiform aortic dilatation  Plan: Continue with annual MRI of the chest surveillance. Risk of dissection becomes significant at a diameter of 5.5 cm.

## 2013-05-25 ENCOUNTER — Other Ambulatory Visit: Payer: Self-pay | Admitting: *Deleted

## 2013-05-25 DIAGNOSIS — I712 Thoracic aortic aneurysm, without rupture: Secondary | ICD-10-CM

## 2013-06-23 ENCOUNTER — Ambulatory Visit: Payer: Medicare Other | Admitting: Cardiothoracic Surgery

## 2013-06-23 ENCOUNTER — Inpatient Hospital Stay: Admission: RE | Admit: 2013-06-23 | Payer: Medicare Other | Source: Ambulatory Visit

## 2013-07-12 ENCOUNTER — Ambulatory Visit
Admission: RE | Admit: 2013-07-12 | Discharge: 2013-07-12 | Disposition: A | Discharge status: Home / Self Care | Payer: Medicare Other | Source: Ambulatory Visit | Attending: Cardiothoracic Surgery | Admitting: Cardiothoracic Surgery

## 2013-07-12 DIAGNOSIS — I712 Thoracic aortic aneurysm, without rupture, unspecified: Secondary | ICD-10-CM

## 2013-07-12 MED ORDER — GADOBENATE DIMEGLUMINE 529 MG/ML IV SOLN
20.0000 mL | Freq: Once | INTRAVENOUS | Status: AC | PRN
  Administered 2013-07-12: 20 mL via INTRAVENOUS

## 2013-07-14 ENCOUNTER — Ambulatory Visit: Payer: Medicare Other | Admitting: Cardiothoracic Surgery

## 2013-08-04 ENCOUNTER — Encounter: Payer: Self-pay | Admitting: Cardiothoracic Surgery

## 2013-08-04 ENCOUNTER — Ambulatory Visit (INDEPENDENT_AMBULATORY_CARE_PROVIDER_SITE_OTHER): Payer: Medicare Other | Admitting: Cardiothoracic Surgery

## 2013-08-04 VITALS — BP 141/84 | HR 60 | Resp 16 | Ht 66.0 in | Wt 203.0 lb

## 2013-08-04 DIAGNOSIS — I7121 Aneurysm of the ascending aorta, without rupture: Secondary | ICD-10-CM

## 2013-08-04 DIAGNOSIS — I712 Thoracic aortic aneurysm, without rupture, unspecified: Secondary | ICD-10-CM

## 2013-08-04 NOTE — Progress Notes (Signed)
PCP is Dwan Bolt, MD Referring Provider is Anda Kraft, MD  Chief Complaint  Patient presents with  . Thoracic Aortic Aneurysm    1 year f/u with MRA Chest    BOF:BPZWCHE one year followup for a fusiform ascending thoracic aneurysm in this 78 year old Afro-American male with hypertension diabetes and a nonsmoker. The patient has an autoimmune dermatologic condition for which he took Prograf for several months directed by the [facility] clinic. He is now off Prograf. He is unsure of his dermatologic diagnosis but is a variant of psoriasis.  Last year his ascending aortic diameter measured at 4.4 cm. The current MRA is reviewed and measures 4.6 cm. There is no evidence of intramural hematoma or penetrating ulcer. The arch and descending thoracic aorta appeared normal. The patient denies any chest pains. His weight has not changed. He states his blood pressures checked at his primary care physician's office every 3 months. His blood pressure medication has not been changed.  Past Medical History  Diagnosis Date  . 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 DM2 (diabetes mellitus, type 2)   . HTN (hypertension)   . HSV infection   . Hyperlipidemia 06/05/2011    No past surgical history on file.  No family history on file.  Social History History  Substance Use Topics  . Smoking status: Former Smoker    Types: Cigarettes    Quit date: 05/28/1983  . Smokeless tobacco: Never Used  . Alcohol Use: No    Current Outpatient Prescriptions  Medication Sig Dispense Refill  . atenolol (TENORMIN) 25 MG tablet Take 25 mg by mouth daily.      . cholecalciferol (VITAMIN D) 1000 UNITS tablet Take 1,000 Units by mouth daily.      Marland Kitchen ezetimibe-simvastatin (VYTORIN) 10-40 MG per tablet Take 1 tablet by mouth at bedtime.      . hydrOXYzine (ATARAX/VISTARIL) 25 MG tablet Take 25 mg by mouth 3 (three) times daily as needed.      Marland Kitchen olmesartan (BENICAR) 40 MG tablet Take  40 mg by mouth daily.       No current facility-administered medications for this visit.    No Known Allergies  Review of Systemsno chest pains, skin rash is minimal currently only on hydroxyzine  BP 141/84  Pulse 60  Resp 16  Ht 5\' 6"  (1.676 m)  Wt 203 lb (92.08 kg)  BMI 32.78 kg/m2  SpO2 98% Physical Exam Alert and pleasant Lungs clear Heart rate regular No murmur Peripheral pulses intact No pedal edema  Diagnostic Tests: MRA reviewed with patient showing slight increase in ascending aortic diameterto 4.6 cm Indication for surgery however would be a diameter of 5.5 cm  His hypertension probably is the main etiologic risk factor for further increase in size of the aneurysm. Patient will start recording home blood pressures at least twice a week and bring the record to his primary care physician for his quarterly exam Impression: Slight increase in moderate ascending fusiform aortic aneurysm, asymptomatic Continue with medical therapy and improved blood pressure surveillance  Plan:return for MRA of thoracic aorta one year

## 2014-06-01 ENCOUNTER — Other Ambulatory Visit: Payer: Self-pay | Admitting: *Deleted

## 2014-06-01 DIAGNOSIS — I712 Thoracic aortic aneurysm, without rupture: Secondary | ICD-10-CM

## 2014-06-01 DIAGNOSIS — I7121 Aneurysm of the ascending aorta, without rupture: Secondary | ICD-10-CM

## 2014-07-08 ENCOUNTER — Other Ambulatory Visit: Payer: Self-pay | Admitting: Cardiothoracic Surgery

## 2014-07-08 DIAGNOSIS — I712 Thoracic aortic aneurysm, without rupture: Secondary | ICD-10-CM

## 2014-07-08 DIAGNOSIS — I7121 Aneurysm of the ascending aorta, without rupture: Secondary | ICD-10-CM

## 2014-07-09 LAB — BUN: BUN: 19 mg/dL (ref 6–23)

## 2014-07-13 ENCOUNTER — Ambulatory Visit (INDEPENDENT_AMBULATORY_CARE_PROVIDER_SITE_OTHER): Payer: Medicare Other | Admitting: Cardiothoracic Surgery

## 2014-07-13 ENCOUNTER — Encounter: Payer: Self-pay | Admitting: Cardiothoracic Surgery

## 2014-07-13 ENCOUNTER — Ambulatory Visit
Admission: RE | Admit: 2014-07-13 | Discharge: 2014-07-13 | Disposition: A | Discharge status: Home / Self Care | Payer: Medicare Other | Source: Ambulatory Visit | Attending: Cardiothoracic Surgery | Admitting: Cardiothoracic Surgery

## 2014-07-13 VITALS — BP 160/90 | HR 48 | Resp 20 | Ht 66.0 in | Wt 212.0 lb

## 2014-07-13 DIAGNOSIS — I7121 Aneurysm of the ascending aorta, without rupture: Secondary | ICD-10-CM

## 2014-07-13 DIAGNOSIS — I712 Thoracic aortic aneurysm, without rupture: Secondary | ICD-10-CM

## 2014-07-13 LAB — CREATINE: Creatinine, Ser: 0.3 mg/dL (ref <1.3)

## 2014-07-13 MED ORDER — GADOBENATE DIMEGLUMINE 529 MG/ML IV SOLN: 20.0000 mL | Freq: Once | INTRAVENOUS | Status: AC | PRN

## 2014-07-13 NOTE — Progress Notes (Signed)
PCP is Dwan Bolt, MD Referring Provider is Anda Kraft, MD  Chief Complaint  Patient presents with  . Thoracic Aortic Aneurysm    1 year f/u with MRA Chest    [id number] returns for annual followup of a fusiform aneurysm of the ascending thoracic aorta measuring approximately 4.5 cm on last imaged. The patient is asymptomatic. There is no prior history in the family of aortic dissection. Patient has controlled hypertension.the patient has no history of aortic valve disease. Patient remains active working out at [employer] 3 days a week. He denies chest pain, dyspnea, ankle edema, orthopnea, presyncope, or palpitation.   Past Medical History  Diagnosis Date  . 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 DM2 (diabetes mellitus, type 2)   . HTN (hypertension)   . HSV infection   . Hyperlipidemia 06/05/2011    History reviewed. No pertinent past surgical history.  History reviewed. No pertinent family history.  Social History History  Substance Use Topics  . Smoking status: Former Smoker    Types: Cigarettes    Quit date: 05/28/1983  . Smokeless tobacco: Never Used  . Alcohol Use: No    Current Outpatient Prescriptions  Medication Sig Dispense Refill  . atenolol (TENORMIN) 25 MG tablet Take 25 mg by mouth daily.    . cholecalciferol (VITAMIN D) 1000 UNITS tablet Take 1,000 Units by mouth daily.    Marland Kitchen ezetimibe-simvastatin (VYTORIN) 10-40 MG per tablet Take 1 tablet by mouth at bedtime.    . hydrOXYzine (ATARAX/VISTARIL) 25 MG tablet Take 25 mg by mouth 3 (three) times daily as needed.    Marland Kitchen olmesartan (BENICAR) 40 MG tablet Take 40 mg by mouth daily.     No current facility-administered medications for this visit.   Facility-Administered Medications Ordered in Other Visits  Medication Dose Route Frequency Provider Last Rate Last Dose  . gadobenate dimeglumine (MULTIHANCE) injection 20 mL  20 mL Intravenous Once PRN Medication Radiologist,  MD        No Known Allergies  Review of Systems    General:   Normal appetite, no weight loss, no fever or night sweats Cardiac:      - Chest pain with exertion,   -chest pain at rest, -  SOB with exertion,                    -PND,  - orthopnea, -  palpitations or arrhythmias,    - history atrial fibrillation                 - dizzy spells-presyncope,  - Syncope, - LE edema Respiratory:  No Shortness of breath,  no home oxygen, no productive cough, no                  sleep apnea, no CPAP at night, no hemoptysis, no COPD GI:           No difficulty swallowing, no reflux, no hiatal hernia-heartburn, no chronic                          abdominal pain, no hematochezia, no hematemesis, no melena GU:        No dysuria, no frequency, no UTI recently, no hematuria, no kidney stones,               No BPH Vascular: No pain suggestive of claudication, no varicose veins, no DVT, no nonhealing  Foot ulcer, no rest pain suggestive of ischemia Neuro:   No stroke, no TIAs, no seizures, no neuropathy, no gait instability, no     memory/cognitive dysfunction                Musculoskeletal:  No arthritis, no joint swelling, no difficulty walking, no decreased                        Mobility Skin:     The patient has stable chronic dermatitis followed by a dermatologist, now off immunosuppression and taking topical ointment Psych:    No anxiety, no depression, Eyes:    No change in vision, no amaurosis, no eye surgery ENT:    No hearing loss, no loose or painful teeth, no dentures, no recent dental                          procedure Hematologic:  No easy bruising, no bleeding disorder, no frequent epistaxes Endocrine:  No diabetes, -  checks CBG at home   BP 160/90 mmHg  Pulse 48  Resp 20  Ht 5\' 6"  (1.676 m)  Wt 212 lb (96.163 kg)  BMI 34.23 kg/m2  SpO2 98% Physical Exam Gen. Appearance-obese black male no acute distress HEENT-normocephalic pupils equal dentition good Neck-supple,  no JVD mass or bruit Cardiac-regular rhythm without murmur, peripheral pulses intact Lungs-clear Lymphatics-no palpable nodes in the neck or as a low Abdomen-soft nontender without pulsatile mass Extremities-no clubbing cyanosis edema Neurologic-nonfocal normal gait Skin-slight dermatitis around the IV site tape on his arm on the scan today  Diagnostic Tests: MRA images personally reviewed and discussed with patient. Patient counseled that his ascending aorta remains moderately enlarged at 4.5 cm. There is no ulceration hematoma or calcium.  Impression: Stable fusiform ascending aneurysm 4.5 cm, followed since Controlled hypertension on atenolol land ARB.  Plan:return with MRA of thoracic aorta in 15 months. Report any severe chest pain or sudden upper back pain immediately to medical facility

## 2014-12-28 ENCOUNTER — Emergency Department (HOSPITAL_BASED_OUTPATIENT_CLINIC_OR_DEPARTMENT_OTHER)
Admission: EM | Admit: 2014-12-28 | Discharge: 2014-12-29 | Disposition: A | Discharge status: Home / Self Care | Payer: Medicare Other | Attending: Emergency Medicine | Admitting: Emergency Medicine

## 2014-12-28 ENCOUNTER — Encounter (HOSPITAL_BASED_OUTPATIENT_CLINIC_OR_DEPARTMENT_OTHER): Payer: Self-pay | Admitting: *Deleted

## 2014-12-28 DIAGNOSIS — Z87891 Personal history of nicotine dependence: Secondary | ICD-10-CM | POA: Insufficient documentation

## 2014-12-28 DIAGNOSIS — Z8619 Personal history of other infectious and parasitic diseases: Secondary | ICD-10-CM | POA: Insufficient documentation

## 2014-12-28 DIAGNOSIS — Z9889 Other specified postprocedural states: Secondary | ICD-10-CM | POA: Diagnosis not present

## 2014-12-28 DIAGNOSIS — E119 Type 2 diabetes mellitus without complications: Secondary | ICD-10-CM | POA: Diagnosis not present

## 2014-12-28 DIAGNOSIS — Z79899 Other long term (current) drug therapy: Secondary | ICD-10-CM | POA: Insufficient documentation

## 2014-12-28 DIAGNOSIS — E785 Hyperlipidemia, unspecified: Secondary | ICD-10-CM | POA: Insufficient documentation

## 2014-12-28 DIAGNOSIS — I10 Essential (primary) hypertension: Secondary | ICD-10-CM

## 2014-12-28 LAB — URINALYSIS, ROUTINE W REFLEX MICROSCOPIC
Bilirubin Urine: NEGATIVE
GLUCOSE, UA: NEGATIVE mg/dL
Hgb urine dipstick: NEGATIVE
Ketones, ur: NEGATIVE mg/dL
Leukocytes, UA: NEGATIVE
Nitrite: NEGATIVE
PH: 6.5 (ref 5.0–8.0)
Protein, ur: NEGATIVE mg/dL
Specific Gravity, Urine: 1.008 (ref 1.005–1.030)
UROBILINOGEN UA: 0.2 mg/dL (ref 0.0–1.0)

## 2014-12-28 LAB — CBC WITH DIFFERENTIAL/PLATELET
BASOS ABS: 0 10*3/uL (ref 0.0–0.1)
BASOS PCT: 0 % (ref 0–1)
EOS PCT: 3 % (ref 0–5)
Eosinophils Absolute: 0.2 10*3/uL (ref 0.0–0.7)
HCT: 36.6 % — ABNORMAL LOW (ref 39.0–52.0)
HEMOGLOBIN: 12.9 g/dL — AB (ref 13.0–17.0)
LYMPHS ABS: 2 10*3/uL (ref 0.7–4.0)
LYMPHS PCT: 31 % (ref 12–46)
MCH: 26 pg (ref 26.0–34.0)
MCHC: 35.2 g/dL (ref 30.0–36.0)
MCV: 73.6 fL — AB (ref 78.0–100.0)
MONOS PCT: 12 % (ref 3–12)
Monocytes Absolute: 0.8 10*3/uL (ref 0.1–1.0)
NEUTROS PCT: 54 % (ref 43–77)
Neutro Abs: 3.5 10*3/uL (ref 1.7–7.7)
Platelets: 130 10*3/uL — ABNORMAL LOW (ref 150–400)
RBC: 4.97 MIL/uL (ref 4.22–5.81)
RDW: 15.1 % (ref 11.5–15.5)
WBC: 6.4 10*3/uL (ref 4.0–10.5)

## 2014-12-28 NOTE — ED Provider Notes (Signed)
TIME SEEN: 11:09 PM  CHIEF COMPLAINT: Hypertension  HPI:  HPI Comments: Travis Mercado is a 79 y.o. male, with a PMhx of DM, HTN, and HLD, 4.5 cm ascending aneurysm that has not dissected and has not been repaired that is being followed by Dr. Prescott Gum who presents to the Emergency Department complaining of waxing and waning, moderate hypertension that began today with an associated headache that has resolved. He reports that when he began to experience a headache earlier today he checked his BP at home when it read 216/100. At baseline his BP runs 120/75mmHg. Pt reports taking both his blood pressure medications as prescribed (atenolol and Benicar) and denies missing any doses recently. Denies any changes in his medication recently. Denies any CP, SOB, current headache, vision changes, numbness, tingling, or weakness, abdominal pain or back pain. States that he does not check his blood pressure regularly. Thinks the last time his blood pressure was checked was 6 months ago. He cannot remember what his blood pressure was at that time. States the only reason he checked it today was because of his mild headache. No sudden onset, severe, thunderclap headache. Headache is currently gone.  ROS: See HPI Constitutional: no fever  Eyes: no drainage  ENT: no runny nose   Cardiovascular:  no chest pain  Resp: no SOB  GI: no vomiting GU: no dysuria Integumentary: no rash  Allergy: no hives  Musculoskeletal: no leg swelling  Neurological: no slurred speech ROS otherwise negative  PAST MEDICAL HISTORY/PAST SURGICAL HISTORY:  Past Medical History  Diagnosis Date  . 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 DM2 (diabetes mellitus, type 2)   . HTN (hypertension)   . HSV infection   . Hyperlipidemia 06/05/2011    MEDICATIONS:  Prior to Admission medications   Medication Sig Start Date End Date Taking? Authorizing Provider  atenolol (TENORMIN) 25 MG tablet Take 25 mg  by mouth daily.   Yes Historical Provider, MD  cholecalciferol (VITAMIN D) 1000 UNITS tablet Take 1,000 Units by mouth daily.   Yes Historical Provider, MD  ezetimibe-simvastatin (VYTORIN) 10-40 MG per tablet Take 1 tablet by mouth at bedtime.   Yes Historical Provider, MD  hydrOXYzine (ATARAX/VISTARIL) 25 MG tablet Take 25 mg by mouth 3 (three) times daily as needed.   Yes Historical Provider, MD  olmesartan (BENICAR) 40 MG tablet Take 40 mg by mouth daily.   Yes Historical Provider, MD  RABEprazole (ACIPHEX) 20 MG tablet Take 20 mg by mouth daily.   Yes Historical Provider, MD    ALLERGIES:  No Known Allergies  SOCIAL HISTORY:  History  Substance Use Topics  . Smoking status: Former Smoker    Types: Cigarettes    Quit date: 05/28/1983  . Smokeless tobacco: Never Used  . Alcohol Use: No    FAMILY HISTORY: No family history on file.  EXAM: BP 215/100 mmHg  Pulse 56  Temp(Src) 98.6 F (37 C) (Oral)  Resp 18  Ht 5\' 6"  (1.676 m)  Wt 212 lb (96.163 kg)  BMI 34.23 kg/m2  SpO2 100% CONSTITUTIONAL: Alert and oriented and responds appropriately to questions. Well-appearing; well-nourished, no distress, smiling, pleasant HEAD: Normocephalic EYES: Conjunctivae clear, PERRL ENT: normal nose; no rhinorrhea; moist mucous membranes; pharynx without lesions noted NECK: Supple, no meningismus, no LAD  CARD: RRR; S1 and S2 appreciated; no murmurs, no clicks, no rubs, no gallops RESP: Normal chest excursion without splinting or tachypnea; breath sounds clear and  equal bilaterally; no wheezes, no rhonchi, no rales, no hypoxia or respiratory distress, speaking full sentences ABD/GI: Normal bowel sounds; non-distended; soft, non-tender, no rebound, no guarding, no peritoneal signs BACK:  The back appears normal and is non-tender to palpation, there is no CVA tenderness EXT: Normal ROM in all joints; non-tender to palpation; no edema; normal capillary refill; no cyanosis, no calf tenderness or  swelling    SKIN: Normal color for age and race; warm NEURO: Moves all extremities equally, sensation to light touch intact diffusely, cranial nerves II through XII intact PSYCH: The patient's mood and manner are appropriate. Grooming and personal hygiene are appropriate.  MEDICAL DECISION MAKING: Patient here with asymptomatic hypertension. He does have a history of a 4.5 cm thoracic aneurysm that is being followed by cardiothoracic surgery. He has not had a repair of this aneurysm and has not had any history of dissection. Currently denies any chest pain, abdominal pain or back pain. Is neurologically intact and no longer has a headache. We'll obtain labs, urine to evaluate for any signs of end organ damage from his hypertension. On review of his previous notes it appears that his blood pressure has been slowly creeping up over the past several years. Will continue to monitor patient in the emergency department.  ED PROGRESS: Labs unremarkable other than mildly elevated creatinine which appears to be his baseline. No proteinuria, hematuria. EKG shows no new ischemic changes and troponin is negative. He is still asymptomatic. Slight improvement with blood pressure without medication. Given his blood pressure is in the 190s/80s and he does have a history of aneurysm will start him on amlodipine 5 mg to continue along with his Benicar and atenolol. Have recommended close outpatient follow-up with his primary care provider. Have discussed with him usual and customary return precautions. I feel he is safe to be discharged home without further emergent workup. I do not feel his blood pressure needs to be acutely lower to this time given I feel that his blood pressure has likely been elevated for some time to all need to be brought down gradually. Patient is comfortable with this plan.    EKG Interpretation  Date/Time:  Wednesday December 28 2014 22:53:59 EDT Ventricular Rate:  48 PR Interval:  328 QRS  Duration: 84 QT Interval:  486 QTC Calculation: 434 R Axis:   -9 Text Interpretation:  Sinus bradycardia with 1st degree A-V block Cannot rule out Anterior infarct , age undetermined Abnormal ECG No significant change since last tracing Confirmed by WARD,  DO, KRISTEN [phone]) on 12/28/2014 11:13:44 PM          I personally performed the services described in this documentation, which was scribed in my presence. The recorded information has been reviewed and is accurate.   [place], DO 12/29/14 0202

## 2014-12-28 NOTE — ED Notes (Signed)
C/o high bp  Slight head discomfort x 1 day

## 2014-12-28 NOTE — ED Notes (Signed)
Pt is here due to HTN.  Pt states that his BP has been up for a few days.  He reports that he has had a syncopal episode on Saturday which was brief and occurred when he got up from getting out of his car.  Pt denies any CP or sob with this HTN, pt has been taking his BP meds as prescribed but states that he has been eating the wrong things.

## 2014-12-29 DIAGNOSIS — I10 Essential (primary) hypertension: Secondary | ICD-10-CM | POA: Diagnosis not present

## 2014-12-29 LAB — BASIC METABOLIC PANEL
Anion gap: 8 (ref 5–15)
BUN: 16 mg/dL (ref 6–20)
CALCIUM: 8.7 mg/dL — AB (ref 8.9–10.3)
CHLORIDE: 104 mmol/L (ref 101–111)
CO2: 26 mmol/L (ref 22–32)
Creatinine, Ser: 1.58 mg/dL — ABNORMAL HIGH (ref 0.61–1.24)
GFR calc non Af Amer: 41 mL/min — ABNORMAL LOW (ref >60)
GFR, EST AFRICAN AMERICAN: 47 mL/min — AB (ref >60)
Glucose, Bld: 94 mg/dL (ref 65–99)
Potassium: 3.8 mmol/L (ref 3.5–5.1)
Sodium: 138 mmol/L (ref 135–145)

## 2014-12-29 LAB — TROPONIN I

## 2014-12-29 MED ORDER — AMLODIPINE BESYLATE 5 MG PO TABS: 5.0000 mg | ORAL_TABLET | Freq: Every day | ORAL | Status: DC

## 2014-12-29 MED ORDER — AMLODIPINE BESYLATE 5 MG PO TABS
5.0000 mg | ORAL_TABLET | Freq: Once | ORAL | Status: AC
  Administered 2014-12-29: 5 mg via ORAL
  Filled 2014-12-29: qty 1

## 2014-12-29 NOTE — Discharge Instructions (Signed)
Hypertension °Hypertension, commonly called high blood pressure, is when the force of blood pumping through your arteries is too strong. Your arteries are the blood vessels that carry blood from your heart throughout your body. A blood pressure reading consists of a higher number over a lower number, such as 110/72. The higher number (systolic) is the pressure inside your arteries when your heart pumps. The lower number (diastolic) is the pressure inside your arteries when your heart relaxes. Ideally you want your blood pressure below 120/80. °Hypertension forces your heart to work harder to pump blood. Your arteries may become narrow or stiff. Having hypertension puts you at risk for heart disease, stroke, and other problems.  °RISK FACTORS °Some risk factors for high blood pressure are controllable. Others are not.  °Risk factors you cannot control include:  °· Race. You may be at higher risk if you are African American. °· Age. Risk increases with age. °· Gender. Men are at higher risk than women before age 45 years. After age 65, women are at higher risk than men. °Risk factors you can control include: °· Not getting enough exercise or physical activity. °· Being overweight. °· Getting too much fat, sugar, calories, or salt in your diet. °· Drinking too much alcohol. °SIGNS AND SYMPTOMS °Hypertension does not usually cause signs or symptoms. Extremely high blood pressure (hypertensive crisis) may cause headache, anxiety, shortness of breath, and nosebleed. °DIAGNOSIS  °To check if you have hypertension, your health care provider will measure your blood pressure while you are seated, with your arm held at the level of your heart. It should be measured at least twice using the same arm. Certain conditions can cause a difference in blood pressure between your right and left arms. A blood pressure reading that is higher than normal on one occasion does not mean that you need treatment. If one blood pressure reading  is high, ask your health care provider about having it checked again. °TREATMENT  °Treating high blood pressure includes making lifestyle changes and possibly taking medicine. Living a healthy lifestyle can help lower high blood pressure. You may need to change some of your habits. °Lifestyle changes may include: °· Following the DASH diet. This diet is high in fruits, vegetables, and whole grains. It is low in salt, red meat, and added sugars. °· Getting at least 2½ hours of brisk physical activity every week. °· Losing weight if necessary. °· Not smoking. °· Limiting alcoholic beverages. °· Learning ways to reduce stress. ° If lifestyle changes are not enough to get your blood pressure under control, your health care provider may prescribe medicine. You may need to take more than one. Work closely with your health care provider to understand the risks and benefits. °HOME CARE INSTRUCTIONS °· Have your blood pressure rechecked as directed by your health care provider.   °· Take medicines only as directed by your health care provider. Follow the directions carefully. Blood pressure medicines must be taken as prescribed. The medicine does not work as well when you skip doses. Skipping doses also puts you at risk for problems.   °· Do not smoke.   °· Monitor your blood pressure at home as directed by your health care provider.  °SEEK MEDICAL CARE IF:  °· You think you are having a reaction to medicines taken. °· You have recurrent headaches or feel dizzy. °· You have swelling in your ankles. °· You have trouble with your vision. °SEEK IMMEDIATE MEDICAL CARE IF: °· You develop a severe headache or confusion. °·   You have unusual weakness, numbness, or feel faint. °· You have severe chest or abdominal pain. °· You vomit repeatedly. °· You have trouble breathing. °MAKE SURE YOU:  °· Understand these instructions. °· Will watch your condition. °· Will get help right away if you are not doing well or get worse. °Document  Released: 05/13/2005 Document Revised: 09/27/2013 Document Reviewed: 03/05/2013 °ExitCare® Patient Information ©2015 [employer]. This information is not intended to replace advice given to you by your health care provider. Make sure you discuss any questions you have with your health care provider. ° °DASH Eating Plan °DASH stands for "Dietary Approaches to Stop Hypertension." The DASH eating plan is a healthy eating plan that has been shown to reduce high blood pressure (hypertension). Additional health benefits may include reducing the risk of type 2 diabetes mellitus, heart disease, and stroke. The DASH eating plan may also help with weight loss. °WHAT DO I NEED TO KNOW ABOUT THE DASH EATING PLAN? °For the DASH eating plan, you will follow these general guidelines: °· Choose foods with a percent daily value for sodium of less than 5% (as listed on the food label). °· Use salt-free seasonings or herbs instead of table salt or sea salt. °· Check with your health care provider or pharmacist before using salt substitutes. °· Eat lower-sodium products, often labeled as "lower sodium" or "no salt added." °· Eat fresh foods. °· Eat more vegetables, fruits, and low-fat dairy products. °· Choose whole grains. Look for the word "whole" as the first word in the ingredient list. °· Choose fish and skinless chicken or turkey more often than red meat. Limit fish, poultry, and meat to 6 oz (170 g) each day. °· Limit sweets, desserts, sugars, and sugary drinks. °· Choose heart-healthy fats. °· Limit cheese to 1 oz (28 g) per day. °· Eat more home-cooked food and less restaurant, buffet, and fast food. °· Limit fried foods. °· Cook foods using methods other than frying. °· Limit canned vegetables. If you do use them, rinse them well to decrease the sodium. °· When eating at a restaurant, ask that your food be prepared with less salt, or no salt if possible. °WHAT FOODS CAN I EAT? °Seek help from a dietitian for individual  calorie needs. °Grains °Whole grain or whole wheat bread. Brown rice. Whole grain or whole wheat pasta. Quinoa, bulgur, and whole grain cereals. Low-sodium cereals. Corn or whole wheat flour tortillas. Whole grain cornbread. Whole grain crackers. Low-sodium crackers. °Vegetables °Fresh or frozen vegetables (raw, steamed, roasted, or grilled). Low-sodium or reduced-sodium tomato and vegetable juices. Low-sodium or reduced-sodium tomato sauce and paste. Low-sodium or reduced-sodium canned vegetables.  °Fruits °All fresh, canned (in natural juice), or frozen fruits. °Meat and Other Protein Products °Ground beef (85% or leaner), grass-fed beef, or beef trimmed of fat. Skinless chicken or turkey. Ground chicken or turkey. Pork trimmed of fat. All fish and seafood. Eggs. Dried beans, peas, or lentils. Unsalted nuts and seeds. Unsalted canned beans. °Dairy °Low-fat dairy products, such as skim or 1% milk, 2% or reduced-fat cheeses, low-fat ricotta or cottage cheese, or plain low-fat yogurt. Low-sodium or reduced-sodium cheeses. °Fats and Oils °Tub margarines without trans fats. Light or reduced-fat mayonnaise and salad dressings (reduced sodium). Avocado. Safflower, olive, or canola oils. Natural peanut or almond butter. °Other °Unsalted popcorn and pretzels. °The items listed above may not be a complete list of recommended foods or beverages. Contact your dietitian for more options. °WHAT FOODS ARE NOT RECOMMENDED? °Grains °White   bread. White pasta. White rice. Refined cornbread. Bagels and croissants. Crackers that contain trans fat. °Vegetables °Creamed or fried vegetables. Vegetables in a cheese sauce. Regular canned vegetables. Regular canned tomato sauce and paste. Regular tomato and vegetable juices. °Fruits °Dried fruits. Canned fruit in light or heavy syrup. Fruit juice. °Meat and Other Protein Products °Fatty cuts of meat. Ribs, chicken wings, bacon, sausage, bologna, salami, chitterlings, fatback, hot dogs,  bratwurst, and packaged luncheon meats. Salted nuts and seeds. Canned beans with salt. °Dairy °Whole or 2% milk, cream, half-and-half, and cream cheese. Whole-fat or sweetened yogurt. Full-fat cheeses or blue cheese. Nondairy creamers and whipped toppings. Processed cheese, cheese spreads, or cheese curds. °Condiments °Onion and garlic salt, seasoned salt, table salt, and sea salt. Canned and packaged gravies. Worcestershire sauce. Tartar sauce. Barbecue sauce. Teriyaki sauce. Soy sauce, including reduced sodium. Steak sauce. Fish sauce. Oyster sauce. Cocktail sauce. Horseradish. Ketchup and mustard. Meat flavorings and tenderizers. Bouillon cubes. Hot sauce. Tabasco sauce. Marinades. Taco seasonings. Relishes. °Fats and Oils °Butter, stick margarine, lard, shortening, ghee, and bacon fat. Coconut, palm kernel, or palm oils. Regular salad dressings. °Other °Pickles and olives. Salted popcorn and pretzels. °The items listed above may not be a complete list of foods and beverages to avoid. Contact your dietitian for more information. °WHERE CAN I FIND MORE INFORMATION? °[employer], [employer]: www.nhlbi.nih.gov/health/health-topics/topics/dash/ °Document Released: 05/02/2011 Document Revised: 09/27/2013 Document Reviewed: 03/17/2013 °ExitCare® Patient Information ©2015 [employer]. This information is not intended to replace advice given to you by your health care provider. Make sure you discuss any questions you have with your health care provider. ° °How to Take Your Blood Pressure °HOW DO I GET A BLOOD PRESSURE MACHINE? °· You can buy an electronic home blood pressure machine at your local pharmacy. Insurance will sometimes cover the cost if you have a prescription. °· Ask your doctor what type of machine is best for you. There are different machines for your arm and your wrist. °· If you decide to buy a machine to check your blood pressure on your arm, first check the size of your arm so you  can buy the right size cuff. To check the size of your arm:   °¨ Use a measuring tape that shows both inches and centimeters.   °¨ Wrap the measuring tape around the upper-middle part of your arm. You may need someone to help you measure.   °¨ Write down your arm measurement in both inches and centimeters.   °· To measure your blood pressure correctly, it is important to have the right size cuff.   °¨ If your arm is up to 13 inches (up to 34 centimeters), get an adult cuff size. °¨ If your arm is 13 to 17 inches (35 to 44 centimeters), get a large adult cuff size.   °¨  If your arm is 17 to 20 inches (45 to 52 centimeters), get an adult thigh cuff.   °WHAT DO THE NUMBERS MEAN?  °· There are two numbers that make up your blood pressure. For example: 120/80. °¨ The first number (120 in our example) is called the "systolic pressure." It is a measure of the pressure in your blood vessels when your heart is pumping blood. °¨ The second number (80 in our example) is called the "diastolic pressure." It is a measure of the pressure in your blood vessels when your heart is resting between beats. °· Your doctor will tell you what your blood pressure should be. °WHAT SHOULD I DO BEFORE I   CHECK MY BLOOD PRESSURE?  °· Try to rest or relax for at least 30 minutes before you check your blood pressure. °· Do not smoke. °· Do not have any drinks with caffeine, such as: °¨ Soda. °¨ Coffee. °¨ Tea. °· Check your blood pressure in a quiet room. °· Sit down and stretch out your arm on a table. Keep your arm at about the level of your heart. Let your arm relax. °· Make sure that your legs are not crossed. °HOW DO I CHECK MY BLOOD PRESSURE? °· Follow the directions that came with your machine. °· Make sure you remove any tight-fighting clothing from your arm or wrist. Wrap the cuff around your upper arm or wrist. You should be able to fit a finger between the cuff and your arm. If you cannot fit a finger between the cuff and your arm, it  is too tight and should be removed and rewrapped. °· Some units require you to manually pump up the arm cuff. °· Automatic units inflate the cuff when you press a button. °· Cuff deflation is automatic in both models. °· After the cuff is inflated, the unit measures your blood pressure and pulse. The readings are shown on a monitor. Hold still and breathe normally while the cuff is inflated. °· Getting a reading takes less than a minute. °· Some models store readings in a memory. Some provide a printout of readings. If your machine does not store your readings, keep a written record. °· Take readings with you to your next visit with your doctor. °Document Released: 04/25/2008 Document Revised: 09/27/2013 Document Reviewed: 07/08/2013 °ExitCare® Patient Information ©2015 [employer]. This information is not intended to replace advice given to you by your health care provider. Make sure you discuss any questions you have with your health care provider. ° °

## 2015-09-14 ENCOUNTER — Other Ambulatory Visit: Payer: Self-pay | Admitting: Cardiothoracic Surgery

## 2015-09-14 DIAGNOSIS — I71019 Dissection of thoracic aorta, unspecified: Secondary | ICD-10-CM

## 2015-09-14 DIAGNOSIS — I712 Thoracic aortic aneurysm, without rupture, unspecified: Secondary | ICD-10-CM

## 2015-09-14 DIAGNOSIS — I7101 Dissection of thoracic aorta: Secondary | ICD-10-CM

## 2015-09-25 DIAGNOSIS — I442 Atrioventricular block, complete: Secondary | ICD-10-CM

## 2015-09-25 HISTORY — DX: Atrioventricular block, complete: I44.2

## 2015-10-11 ENCOUNTER — Encounter (HOSPITAL_COMMUNITY)
Admission: EM | Disposition: A | Discharge status: Home / Self Care | Payer: Self-pay | Source: Home / Self Care | Attending: Cardiology

## 2015-10-11 ENCOUNTER — Inpatient Hospital Stay (HOSPITAL_BASED_OUTPATIENT_CLINIC_OR_DEPARTMENT_OTHER)
Admission: EM | Admit: 2015-10-11 | Discharge: 2015-10-12 | DRG: 243 | Disposition: A | Discharge status: Home / Self Care | Payer: Medicare Other | Attending: Cardiology | Admitting: Cardiology

## 2015-10-11 ENCOUNTER — Other Ambulatory Visit: Payer: Self-pay | Admitting: Cardiothoracic Surgery

## 2015-10-11 ENCOUNTER — Inpatient Hospital Stay (HOSPITAL_COMMUNITY): Payer: Medicare Other

## 2015-10-11 ENCOUNTER — Emergency Department (HOSPITAL_BASED_OUTPATIENT_CLINIC_OR_DEPARTMENT_OTHER): Payer: Medicare Other

## 2015-10-11 ENCOUNTER — Encounter (HOSPITAL_BASED_OUTPATIENT_CLINIC_OR_DEPARTMENT_OTHER): Payer: Self-pay | Admitting: Emergency Medicine

## 2015-10-11 DIAGNOSIS — R200 Anesthesia of skin: Secondary | ICD-10-CM | POA: Diagnosis present

## 2015-10-11 DIAGNOSIS — E785 Hyperlipidemia, unspecified: Secondary | ICD-10-CM | POA: Insufficient documentation

## 2015-10-11 DIAGNOSIS — Z79899 Other long term (current) drug therapy: Secondary | ICD-10-CM | POA: Diagnosis not present

## 2015-10-11 DIAGNOSIS — G451 Carotid artery syndrome (hemispheric): Secondary | ICD-10-CM

## 2015-10-11 DIAGNOSIS — N183 Chronic kidney disease, stage 3 (moderate): Secondary | ICD-10-CM | POA: Diagnosis present

## 2015-10-11 DIAGNOSIS — R55 Syncope and collapse: Secondary | ICD-10-CM

## 2015-10-11 DIAGNOSIS — G459 Transient cerebral ischemic attack, unspecified: Secondary | ICD-10-CM | POA: Insufficient documentation

## 2015-10-11 DIAGNOSIS — Z87891 Personal history of nicotine dependence: Secondary | ICD-10-CM

## 2015-10-11 DIAGNOSIS — I442 Atrioventricular block, complete: Secondary | ICD-10-CM | POA: Diagnosis present

## 2015-10-11 DIAGNOSIS — I441 Atrioventricular block, second degree: Secondary | ICD-10-CM | POA: Diagnosis not present

## 2015-10-11 DIAGNOSIS — I679 Cerebrovascular disease, unspecified: Secondary | ICD-10-CM | POA: Insufficient documentation

## 2015-10-11 DIAGNOSIS — R001 Bradycardia, unspecified: Secondary | ICD-10-CM

## 2015-10-11 DIAGNOSIS — I712 Thoracic aortic aneurysm, without rupture, unspecified: Secondary | ICD-10-CM

## 2015-10-11 DIAGNOSIS — E1122 Type 2 diabetes mellitus with diabetic chronic kidney disease: Secondary | ICD-10-CM | POA: Diagnosis present

## 2015-10-11 DIAGNOSIS — I129 Hypertensive chronic kidney disease with stage 1 through stage 4 chronic kidney disease, or unspecified chronic kidney disease: Secondary | ICD-10-CM | POA: Diagnosis present

## 2015-10-11 DIAGNOSIS — Z95818 Presence of other cardiac implants and grafts: Secondary | ICD-10-CM | POA: Insufficient documentation

## 2015-10-11 DIAGNOSIS — I459 Conduction disorder, unspecified: Secondary | ICD-10-CM | POA: Diagnosis present

## 2015-10-11 DIAGNOSIS — E1159 Type 2 diabetes mellitus with other circulatory complications: Secondary | ICD-10-CM | POA: Insufficient documentation

## 2015-10-11 DIAGNOSIS — R29810 Facial weakness: Secondary | ICD-10-CM | POA: Diagnosis not present

## 2015-10-11 DIAGNOSIS — Z959 Presence of cardiac and vascular implant and graft, unspecified: Secondary | ICD-10-CM

## 2015-10-11 DIAGNOSIS — N179 Acute kidney failure, unspecified: Secondary | ICD-10-CM | POA: Diagnosis present

## 2015-10-11 HISTORY — DX: Cardiac murmur, unspecified: R01.1

## 2015-10-11 HISTORY — DX: Atrioventricular block, complete: I44.2

## 2015-10-11 HISTORY — DX: Gastro-esophageal reflux disease without esophagitis: K21.9

## 2015-10-11 HISTORY — PX: EP IMPLANTABLE DEVICE: SHX172B

## 2015-10-11 LAB — CBC WITH DIFFERENTIAL/PLATELET
Basophils Absolute: 0 10*3/uL (ref 0.0–0.1)
Basophils Relative: 0 %
EOS ABS: 0.1 10*3/uL (ref 0.0–0.7)
EOS PCT: 2 %
HCT: 38.3 % — ABNORMAL LOW (ref 39.0–52.0)
HEMOGLOBIN: 14.1 g/dL (ref 13.0–17.0)
LYMPHS ABS: 3.8 10*3/uL (ref 0.7–4.0)
Lymphocytes Relative: 45 %
MCH: 72.5 pg — AB (ref 26.0–34.0)
MCHC: 36.8 g/dL — AB (ref 30.0–36.0)
MCV: 72.5 fL — ABNORMAL LOW (ref 78.0–100.0)
MONOS PCT: 12 %
Monocytes Absolute: 1 10*3/uL (ref 0.1–1.0)
NEUTROS PCT: 41 %
Neutro Abs: 3.5 10*3/uL (ref 1.7–7.7)
Platelets: 163 10*3/uL (ref 150–400)
RBC: 5.28 MIL/uL (ref 4.22–5.81)
RDW: 15.1 % (ref 11.5–15.5)
WBC: 8.4 10*3/uL (ref 4.0–10.5)

## 2015-10-11 LAB — COMPREHENSIVE METABOLIC PANEL
ALK PHOS: 38 U/L (ref 38–126)
ALT: 14 U/L — ABNORMAL LOW (ref 17–63)
ANION GAP: 6 (ref 5–15)
AST: 20 U/L (ref 15–41)
Albumin: 3.9 g/dL (ref 3.5–5.0)
BILIRUBIN TOTAL: 0.7 mg/dL (ref 0.3–1.2)
BUN: 22 mg/dL — ABNORMAL HIGH (ref 6–20)
CALCIUM: 8.9 mg/dL (ref 8.9–10.3)
CO2: 24 mmol/L (ref 22–32)
Chloride: 108 mmol/L (ref 101–111)
Creatinine, Ser: 1.94 mg/dL — ABNORMAL HIGH (ref 0.61–1.24)
GFR, EST AFRICAN AMERICAN: 37 mL/min — AB (ref >60)
GFR, EST NON AFRICAN AMERICAN: 32 mL/min — AB (ref >60)
Glucose, Bld: 117 mg/dL — ABNORMAL HIGH (ref 65–99)
Potassium: 3.9 mmol/L (ref 3.5–5.1)
SODIUM: 138 mmol/L (ref 135–145)
TOTAL PROTEIN: 6.9 g/dL (ref 6.5–8.1)

## 2015-10-11 LAB — LIPID PANEL
CHOL/HDL RATIO: 2.3 ratio
CHOLESTEROL: 132 mg/dL (ref 0–200)
HDL: 57 mg/dL (ref >40)
LDL Cholesterol: 59 mg/dL (ref 0–99)
TRIGLYCERIDES: 80 mg/dL (ref <150)
VLDL: 16 mg/dL (ref 0–40)

## 2015-10-11 LAB — GLUCOSE, CAPILLARY
GLUCOSE-CAPILLARY: 97 mg/dL (ref 65–99)
Glucose-Capillary: 103 mg/dL — ABNORMAL HIGH (ref 65–99)
Glucose-Capillary: 115 mg/dL — ABNORMAL HIGH (ref 65–99)

## 2015-10-11 LAB — CBC
HCT: 36.8 % — ABNORMAL LOW (ref 39.0–52.0)
Hemoglobin: 12.7 g/dL — ABNORMAL LOW (ref 13.0–17.0)
MCH: 25.9 pg — ABNORMAL LOW (ref 26.0–34.0)
MCHC: 34.5 g/dL (ref 30.0–36.0)
MCV: 74.9 fL — AB (ref 78.0–100.0)
PLATELETS: 149 10*3/uL — AB (ref 150–400)
RBC: 4.91 MIL/uL (ref 4.22–5.81)
RDW: 15.2 % (ref 11.5–15.5)
WBC: 7 10*3/uL (ref 4.0–10.5)

## 2015-10-11 LAB — CREATININE, SERUM
Creatinine, Ser: 1.63 mg/dL — ABNORMAL HIGH (ref 0.61–1.24)
GFR calc non Af Amer: 39 mL/min — ABNORMAL LOW (ref >60)
GFR, EST AFRICAN AMERICAN: 45 mL/min — AB (ref >60)

## 2015-10-11 LAB — APTT: aPTT: 32 seconds (ref 24–37)

## 2015-10-11 LAB — CBG MONITORING, ED: GLUCOSE-CAPILLARY: 108 mg/dL — AB (ref 65–99)

## 2015-10-11 LAB — TSH: TSH: 2.775 u[IU]/mL (ref 0.350–4.500)

## 2015-10-11 LAB — PROTIME-INR
INR: 1.27 (ref 0.00–1.49)
PROTHROMBIN TIME: 16 s — AB (ref 11.6–15.2)

## 2015-10-11 LAB — ECHOCARDIOGRAM COMPLETE: Weight: 3421.54 oz

## 2015-10-11 LAB — TROPONIN I

## 2015-10-11 SURGERY — PACEMAKER IMPLANT

## 2015-10-11 MED ORDER — CHLORHEXIDINE GLUCONATE 4 % EX LIQD
60.0000 mL | Freq: Once | CUTANEOUS | Status: DC
Start: 2015-10-11 — End: 2015-10-11

## 2015-10-11 MED ORDER — HEPARIN (PORCINE) IN NACL 2-0.9 UNIT/ML-% IJ SOLN
INTRAMUSCULAR | Status: DC | PRN
  Administered 2015-10-11: 16:00:00

## 2015-10-11 MED ORDER — SODIUM CHLORIDE 0.9 % IR SOLN
Status: AC
  Filled 2015-10-11: qty 2

## 2015-10-11 MED ORDER — CEFAZOLIN SODIUM-DEXTROSE 2-3 GM-% IV SOLR
INTRAVENOUS | Status: DC | PRN
  Administered 2015-10-11: 2 g via INTRAVENOUS

## 2015-10-11 MED ORDER — INSULIN ASPART 100 UNIT/ML ~~LOC~~ SOLN: 0.0000 [IU] | Freq: Three times a day (TID) | SUBCUTANEOUS | Status: DC

## 2015-10-11 MED ORDER — LIDOCAINE HCL (PF) 1 % IJ SOLN
INTRAMUSCULAR | Status: AC
  Filled 2015-10-11: qty 60

## 2015-10-11 MED ORDER — MIDAZOLAM HCL 5 MG/5ML IJ SOLN
INTRAMUSCULAR | Status: DC | PRN
  Administered 2015-10-11: 2 mg via INTRAVENOUS

## 2015-10-11 MED ORDER — SODIUM CHLORIDE 0.9 % IR SOLN
80.0000 mg | Status: AC
  Administered 2015-10-11: 80 mg

## 2015-10-11 MED ORDER — LIDOCAINE HCL (PF) 1 % IJ SOLN
INTRAMUSCULAR | Status: DC | PRN
  Administered 2015-10-11: 31 mL

## 2015-10-11 MED ORDER — ACETAMINOPHEN 650 MG RE SUPP: 650.0000 mg | Freq: Four times a day (QID) | RECTAL | Status: DC | PRN

## 2015-10-11 MED ORDER — ENOXAPARIN SODIUM 30 MG/0.3ML ~~LOC~~ SOLN: 30.0000 mg | Freq: Every day | SUBCUTANEOUS | Status: DC

## 2015-10-11 MED ORDER — CHLORHEXIDINE GLUCONATE 4 % EX LIQD
60.0000 mL | Freq: Once | CUTANEOUS | Status: AC
  Administered 2015-10-11: 4 via TOPICAL
  Filled 2015-10-11: qty 60

## 2015-10-11 MED ORDER — CEFAZOLIN SODIUM 1-5 GM-% IV SOLN
1.0000 g | Freq: Four times a day (QID) | INTRAVENOUS | Status: DC
  Administered 2015-10-11 – 2015-10-12 (×2): 1 g via INTRAVENOUS
  Filled 2015-10-11 (×3): qty 50

## 2015-10-11 MED ORDER — ASPIRIN EC 81 MG PO TBEC
81.0000 mg | DELAYED_RELEASE_TABLET | Freq: Every day | ORAL | Status: DC
  Administered 2015-10-11: 81 mg via ORAL
  Filled 2015-10-11: qty 1

## 2015-10-11 MED ORDER — SODIUM CHLORIDE 0.9 % IV SOLN: INTRAVENOUS | Status: DC

## 2015-10-11 MED ORDER — POLYETHYLENE GLYCOL 3350 17 G PO PACK: 17.0000 g | PACK | Freq: Every day | ORAL | Status: DC | PRN

## 2015-10-11 MED ORDER — FENTANYL CITRATE (PF) 100 MCG/2ML IJ SOLN
INTRAMUSCULAR | Status: AC
  Filled 2015-10-11: qty 2

## 2015-10-11 MED ORDER — ACETAMINOPHEN 325 MG PO TABS: 325.0000 mg | ORAL_TABLET | ORAL | Status: DC | PRN

## 2015-10-11 MED ORDER — CEFAZOLIN SODIUM-DEXTROSE 2-4 GM/100ML-% IV SOLN: 2.0000 g | INTRAVENOUS | Status: DC

## 2015-10-11 MED ORDER — ONDANSETRON HCL 4 MG/2ML IJ SOLN: 4.0000 mg | Freq: Four times a day (QID) | INTRAMUSCULAR | Status: DC | PRN

## 2015-10-11 MED ORDER — CEFAZOLIN SODIUM-DEXTROSE 2-4 GM/100ML-% IV SOLN
INTRAVENOUS | Status: AC
  Filled 2015-10-11: qty 100

## 2015-10-11 MED ORDER — ACETAMINOPHEN 325 MG PO TABS: 650.0000 mg | ORAL_TABLET | Freq: Four times a day (QID) | ORAL | Status: DC | PRN

## 2015-10-11 MED ORDER — EZETIMIBE-SIMVASTATIN 10-40 MG PO TABS
1.0000 | ORAL_TABLET | Freq: Every day | ORAL | Status: DC
  Filled 2015-10-11: qty 1

## 2015-10-11 MED ORDER — GADOBENATE DIMEGLUMINE 529 MG/ML IV SOLN
20.0000 mL | Freq: Once | INTRAVENOUS | Status: AC | PRN
  Administered 2015-10-11: 20 mL via INTRAVENOUS

## 2015-10-11 MED ORDER — MIDAZOLAM HCL 5 MG/5ML IJ SOLN
INTRAMUSCULAR | Status: AC
  Filled 2015-10-11: qty 5

## 2015-10-11 MED ORDER — FENTANYL CITRATE (PF) 100 MCG/2ML IJ SOLN
INTRAMUSCULAR | Status: DC | PRN
  Administered 2015-10-11: 25 ug via INTRAVENOUS

## 2015-10-11 MED ORDER — PANTOPRAZOLE SODIUM 40 MG PO TBEC
40.0000 mg | DELAYED_RELEASE_TABLET | Freq: Every day | ORAL | Status: DC
  Administered 2015-10-11: 40 mg via ORAL
  Filled 2015-10-11: qty 1

## 2015-10-11 SURGICAL SUPPLY — 7 items
CABLE SURGICAL S-101-97-12 (CABLE) ×4 IMPLANT
LEAD CAPSURE NOVUS 5076-52CM (Lead) ×2 IMPLANT
LEAD CAPSURE NOVUS 5076-58CM (Lead) ×2 IMPLANT
PAD DEFIB LIFELINK (PAD) ×2 IMPLANT
PPM ADVISA MRI DR A2DR01 (Pacemaker) ×2 IMPLANT
SHEATH CLASSIC 7F (SHEATH) ×4 IMPLANT
TRAY PACEMAKER INSERTION (PACKS) ×2 IMPLANT

## 2015-10-11 NOTE — Consult Note (Signed)
NEURO HOSPITALIST CONSULT NOTE      Reason for Consult: facial numbness    History obtained from:  Patient    HPI:                                                                                                                                          Travis Mercado is an 80 yo male with hx as above presented to OSH with R lower face numbness that resolved after 30 min. 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 As per cardiology will have PM today.  Past Medical History  Diagnosis Date  . 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 DM2 (diabetes mellitus, type 2) ([place])   . HTN (hypertension)   . HSV infection   . Hyperlipidemia 06/05/2011    History reviewed. No pertinent past surgical history.  History reviewed. No pertinent family history.   Social History:  reports that he quit smoking about 32 years ago. His smoking use included Cigarettes. He has never used smokeless tobacco. He reports that he does not drink alcohol or use illicit drugs.  No Known Allergies  MEDICATIONS:                                                                                                                     I have reviewed the patient's current medications.   ROS:                                                                                                                                       History obtained from chart review and  the patient  General ROS: negative for - chills, fatigue, fever, night sweats, weight gain or weight loss Psychological ROS: negative for - behavioral disorder, hallucinations, memory difficulties, mood swings or suicidal ideation Ophthalmic ROS: negative for - blurry vision, double vision, eye pain or loss of vision ENT ROS: negative for - epistaxis, nasal discharge, oral lesions, sore throat, tinnitus or vertigo Allergy and Immunology ROS: negative for -  hives or itchy/watery eyes Hematological and Lymphatic ROS: negative for - bleeding problems, bruising or swollen lymph nodes Endocrine ROS: negative for - galactorrhea, hair pattern changes, polydipsia/polyuria or temperature intolerance Respiratory ROS: negative for - cough, hemoptysis, shortness of breath or wheezing Cardiovascular ROS: negative for - chest pain, dyspnea on exertion, edema or irregular heartbeat Gastrointestinal ROS: negative for - abdominal pain, diarrhea, hematemesis, nausea/vomiting or stool incontinence Genito-Urinary ROS: negative for - dysuria, hematuria, incontinence or urinary frequency/urgency Musculoskeletal ROS: negative for - joint swelling or muscular weakness Neurological ROS: as noted in HPI Dermatological ROS: negative for rash and skin lesion changes   Blood pressure 147/61, pulse 34, temperature 97.5 F (36.4 C), temperature source Oral, resp. rate 18, weight 97 kg (213 lb 13.5 oz), SpO2 100 %.   Neurologic Examination:                                                                                                      HEENT-  Normocephalic, no lesions, without obvious abnormality.  Normal external eye and conjunctiva.  Normal TM's bilaterally.  Normal auditory canals and external ears. Normal external nose, mucus membranes and septum.  Normal pharynx.  Lungs- chest clear, no wheezing, rales, normal symmetric air entry, Heart exam - S1, S2 normal, no murmur, no gallop, rate regular Abdomen- soft, non-tender; bowel sounds normal; no masses,  no organomegaly   Neurological Examination Mental Status: Alert, oriented, thought content appropriate.  Speech fluent without evidence of aphasia.  Able to follow 3 step commands without difficulty. Cranial Nerves: II: Discs flat bilaterally; Visual fields grossly normal, pupils equal, round, reactive to light and accommodation III,IV, VI: ptosis not present, extra-ocular motions intact bilaterally V,VII: smile  symmetric, facial light touch sensation normal bilaterally VIII: hearing normal bilaterally IX,X: uvula rises symmetrically XI: bilateral shoulder shrug XII: midline tongue extension Motor: Right : Upper extremity   5/5    Left:     Upper extremity   5/5  Lower extremity   5/5     Lower extremity   5/5 Tone and bulk:normal tone throughout; no atrophy noted Sensory: Pinprick and light touch intact throughout, bilaterally throughout Plantars: Right: downgoing   Left: downgoing Cerebellar: normal finger-to-nose, normal rapid alternating movements and normal heel-to-shin test       Lab Results: Basic Metabolic Panel:  Recent Labs Lab 10/11/15 0040  NA 138  K 3.9  CL 108  CO2 24  GLUCOSE 117*  BUN 22*  CREATININE 1.94*  CALCIUM 8.9    Liver Function Tests:  Recent Labs Lab 10/11/15 0040  AST 20  ALT 14*  ALKPHOS 38  BILITOT 0.7  PROT 6.9  ALBUMIN  3.9   No results for input(s): LIPASE, AMYLASE in the last 168 hours. No results for input(s): AMMONIA in the last 168 hours.  CBC:  Recent Labs Lab 10/11/15 0040  WBC 8.4  NEUTROABS 3.5  HGB 14.1  HCT 38.3*  MCV 72.5*  PLT 163    Cardiac Enzymes:  Recent Labs Lab 10/11/15 0040  TROPONINI <0.03    Lipid Panel: No results for input(s): CHOL, TRIG, HDL, CHOLHDL, VLDL, LDLCALC in the last 168 hours.  CBG:  Recent Labs Lab 10/11/15 0042  GLUCAP 108*    Microbiology: No results found for this or any previous visit.  Coagulation Studies:  Recent Labs  10/11/15 0040  LABPROT 16.0*  INR 1.27    Imaging: Ct Head Code Stroke W/o Cm  10/11/2015  CLINICAL DATA:  Code stroke. Right facial droop for 1 hour. Unsteady gait. History of hypertension. EXAM: CT HEAD WITHOUT CONTRAST TECHNIQUE: Contiguous axial images were obtained from the base of the skull through the vertex without intravenous contrast. COMPARISON:  None. FINDINGS: Ventricles and sulci appear symmetrical. No mass effect or midline  shift. No ventricular dilatation. Mild cerebral atrophy. Gray-white matter junctions are distinct. Basal cisterns are not effaced. No abnormal extra-axial fluid collections. No evidence of acute intracranial hemorrhage. Calvarium appears intact. Mild mucosal thickening in the paranasal sinuses. Mastoid air cells are not opacified. Vascular calcifications. IMPRESSION: No acute intracranial abnormalities. Code stroke results were called by telephone at 1:20 a.m. on 10/11/2015 to Dr. Randal Buba. Electronically Signed   By: Lucienne Capers M.D.   On: 10/11/2015 04:42      Assessment/Plan:   80 yo male with hx as above presented to OSH with R lower face numbness that resolved after 30 min. 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 As per cardiology will have PM today.  TIA workup as below   1. HgbA1c, fasting lipid panel 2. MRI, MRA  of the brain without contrast 3. PT consult, OT consult, Speech consult 4. Echocardiogram 5. Carotid dopplers 6. Prophylactic therapy-Antiplatelet med: Aspirin - dose 81mg  7. Risk factor modification 8. Telemetry monitoring 9. Frequent neuro checks 10 NPO until passes stroke swallow screen 11 please page stroke NP  Or  PA  Or MD from 8am -4 pm  as this patient from this time will be  followed by the stroke.   You can look them up on [email or web address]  Password [email or web address]

## 2015-10-11 NOTE — Progress Notes (Signed)
Echocardiogram 2D Echocardiogram has been performed.  Travis Mercado 10/11/2015, 10:33 AM

## 2015-10-11 NOTE — Progress Notes (Signed)
Utilization review completed.  

## 2015-10-11 NOTE — H&P (Signed)
PCP: [facility]  HPI:  80 y/o with h/o HTN, HL, diet-controlled DM2, CKD and small ascending Ao aneurysm (followed by Dr. Prescott Gum) transferred from [facility] for 2:1 AV block and symptomatic bradycardia.   Denies any h/o known h/o CAD. Had a stress test many years ago but no cath. Remains active without exertional symptoms. ([place]es to [employer] and [facility]). Hadbradycardia several months and wore a montior but told he didn't need PM yet. Atenolol stopped at that point.   Last night developed right tingling and nausea. Went to [place] and Code Stroke activated. Symptoms resolved quickly (NIH 2). CT brain showed no acute process. Case discussed with Neurology and suggested transfer for further evaluation.   Found to be in 2:1 heart block with HRs in 30-40s and transferred here. Feels ok. No CP or dizziness.    Review of Systems:     Cardiac Review of Systems: {Y] = yes [ ]  = no  Chest Pain [    ]  Resting SOB [   ] Exertional SOB  [  ]  Orthopnea [  ]   Pedal Edema [   ]    Palpitations [  ] Syncope  [  ]   Presyncope [   ]  General Review of Systems: [Y] = yes [  ]=no Constitional: recent weight change [  ]; anorexia [  ]; fatigue [ y ]; nausea [  ]; night sweats [  ]; fever [  ]; or chills [  ];                                             Dental: poor dentition[  ];   Eye : blurred vision [  ]; diplopia [   ]; vision changes [  ];  Amaurosis fugax[  ]; Resp: cough [  ];  wheezing[  ];  hemoptysis[  ]; shortness of breath[  ]; paroxysmal nocturnal dyspnea[  ]; dyspnea on exertion[  ]; or orthopnea[  ];  GI:  gallstones[  ], vomiting[  ];  dysphagia[  ]; melena[  ];  hematochezia [  ]; heartburn[  ];   GU: kidney stones [  ]; hematuria[  ];   dysuria [  ];  nocturia[  ];               Skin: rash [  ], swelling[  ];, hair loss[  ];  peripheral edema[  ];  or itching[  ]; Musculosketetal: myalgias[  ];  joint swelling[  ];  joint erythema[  ];  joint pain[ y ];  back pain[   ];  Heme/Lymph: bruising[  ];  bleeding[  ];  anemia[  ];  Neuro: Florentina.Bell  ];  headaches[  ];  stroke[  ];  vertigo[  ];  seizures[  ];   paresthesias[  ];  difficulty walking[ y ];  Psych:depression[  ]; anxiety[  ];  Endocrine: diabetes[  ];  thyroid dysfunction[  ];  Other:  Past Medical History  Diagnosis Date  . 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 DM2 (diabetes mellitus, type 2) ([place])   . HTN (hypertension)   . HSV infection   . Hyperlipidemia 06/05/2011   Prior to Admission medications   Medication Sig Start Date End Date Taking? Authorizing Provider  amLODipine (  NORVASC) 5 MG tablet Take 1 tablet (5 mg total) by mouth daily. 12/29/14   Kristen N Ward, DO  atenolol (TENORMIN) 25 MG tablet Take 25 mg by mouth daily.    Historical Provider, MD  cholecalciferol (VITAMIN D) 1000 UNITS tablet Take 1,000 Units by mouth daily.    Historical Provider, MD  ezetimibe-simvastatin (VYTORIN) 10-40 MG per tablet Take 1 tablet by mouth at bedtime.    Historical Provider, MD  hydrOXYzine (ATARAX/VISTARIL) 25 MG tablet Take 25 mg by mouth 3 (three) times daily as needed.    Historical Provider, MD  olmesartan (BENICAR) 40 MG tablet Take 40 mg by mouth daily.    Historical Provider, MD  RABEprazole (ACIPHEX) 20 MG tablet Take 20 mg by mouth daily.    Historical Provider, MD     No Known Allergies  Social History   Social History  . Marital Status: Married    Spouse Name: N/A  . Number of Children: N/A  . Years of Education: N/A   Occupational History  . Not on file.   Social History Main Topics  . Smoking status: Former Smoker    Types: Cigarettes    Quit date: 05/28/1983  . Smokeless tobacco: Never Used  . Alcohol Use: No  . Drug Use: No  . Sexual Activity: Not on file   Other Topics Concern  . Not on file   Social History Narrative   Family History:  No Family history of premature heart disease or sudden cardiac death.   PHYSICAL  EXAM: Filed Vitals:   10/11/15 0325  BP: 147/61  Pulse: 34  Temp: 97.5 F (36.4 C)  Resp: 18   General:  Well appearing. No respiratory difficulty HEENT: normal Neck: supple. no JVD. Carotids 2+ bilat; no bruits. No lymphadenopathy or thryomegaly appreciated. Cor: PMI nondisplaced. Loletha Grayer regular.  Lungs: clear Abdomen: soft, nontender, nondistended. No hepatosplenomegaly. No bruits or masses. Good bowel sounds. Extremities: no cyanosis, clubbing, rash, edema Neuro: alert & oriented x 3, cranial nerves grossly intact. moves all 4 extremities w/o difficulty. Affect pleasant.  ECG: NSR with 2:1 heart block . Rate 41. (PR 64ms, QRS 30ms)   Results for orders placed or performed during the hospital encounter of 10/11/15 (from the past 24 hour(s))  CBC with Differential/Platelet     Status: Abnormal   Collection Time: 10/11/15 12:40 AM  Result Value Ref Range   WBC 8.4 4.0 - 10.5 K/uL   RBC 5.28 4.22 - 5.81 MIL/uL   Hemoglobin 14.1 13.0 - 17.0 g/dL   HCT 38.3 (L) 39.0 - 52.0 %   MCV 72.5 (L) 78.0 - 100.0 fL   MCH 72.5 (H) 26.0 - 34.0 pg   MCHC 36.8 (H) 30.0 - 36.0 g/dL   RDW 15.1 11.5 - 15.5 %   Platelets 163 150 - 400 K/uL   Neutrophils Relative % 41 %   Neutro Abs 3.5 1.7 - 7.7 K/uL   Lymphocytes Relative 45 %   Lymphs Abs 3.8 0.7 - 4.0 K/uL   Monocytes Relative 12 %   Monocytes Absolute 1.0 0.1 - 1.0 K/uL   Eosinophils Relative 2 %   Eosinophils Absolute 0.1 0.0 - 0.7 K/uL   Basophils Relative 0 %   Basophils Absolute 0.0 0.0 - 0.1 K/uL  Comprehensive metabolic panel     Status: Abnormal   Collection Time: 10/11/15 12:40 AM  Result Value Ref Range   Sodium 138 135 - 145 mmol/L   Potassium 3.9 3.5 - 5.1 mmol/L  Chloride 108 101 - 111 mmol/L   CO2 24 22 - 32 mmol/L   Glucose, Bld 117 (H) 65 - 99 mg/dL   BUN 22 (H) 6 - 20 mg/dL   Creatinine, Ser 1.94 (H) 0.61 - 1.24 mg/dL   Calcium 8.9 8.9 - 10.3 mg/dL   Total Protein 6.9 6.5 - 8.1 g/dL   Albumin 3.9 3.5 - 5.0 g/dL    AST 20 15 - 41 U/L   ALT 14 (L) 17 - 63 U/L   Alkaline Phosphatase 38 38 - 126 U/L   Total Bilirubin 0.7 0.3 - 1.2 mg/dL   GFR calc non Af Amer 32 (L) >60 mL/min   GFR calc Af Amer 37 (L) >60 mL/min   Anion gap 6 5 - 15  Troponin I     Status: None   Collection Time: 10/11/15 12:40 AM  Result Value Ref Range   Troponin I <0.03 <0.031 ng/mL  Protime-INR     Status: Abnormal   Collection Time: 10/11/15 12:40 AM  Result Value Ref Range   Prothrombin Time 16.0 (H) 11.6 - 15.2 seconds   INR 1.27 0.00 - 1.49  APTT     Status: None   Collection Time: 10/11/15 12:40 AM  Result Value Ref Range   aPTT 32 24 - 37 seconds  CBG monitoring, ED     Status: Abnormal   Collection Time: 10/11/15 12:42 AM  Result Value Ref Range   Glucose-Capillary 108 (H) 65 - 99 mg/dL   Ct Head Code Stroke W/o Cm  10/11/2015  CLINICAL DATA:  Code stroke. Right facial droop for 1 hour. Unsteady gait. History of hypertension. EXAM: CT HEAD WITHOUT CONTRAST TECHNIQUE: Contiguous axial images were obtained from the base of the skull through the vertex without intravenous contrast. COMPARISON:  None. FINDINGS: Ventricles and sulci appear symmetrical. No mass effect or midline shift. No ventricular dilatation. Mild cerebral atrophy. Gray-white matter junctions are distinct. Basal cisterns are not effaced. No abnormal extra-axial fluid collections. No evidence of acute intracranial hemorrhage. Calvarium appears intact. Mild mucosal thickening in the paranasal sinuses. Mastoid air cells are not opacified. Vascular calcifications. IMPRESSION: No acute intracranial abnormalities. Code stroke results were called by telephone at 1:20 a.m. on 10/11/2015 to Dr. Randal Buba. Electronically Signed   By: Lucienne Capers M.D.   On: 10/11/2015 04:42    ASSESSMENT: 1. R facial tingling. Possible TIA 2. Symptomatic bradycardia with 2:1 AV block 3. HTN 4. HL 5. Stable 4.4 cm ascending Ao aneurysm 6. DM2, diet controlled 7. Acute on  CKD, stage III (Creatinine 1.58-> 1.94)  PLAN/DISCUSSION:  Neuro has seen for possible TIA. Will get brain MR +/- MRA renal function permitting. Check echo and carotid dopplers. Suspect symptoms related to bradycardia and not frank TIA.  For heart block will watch on Tele. EP to see to decide on PPM. Check TSH. Hold anti-hypertensives for now.   Bensimhon, Daniel,MD 4:59 AM

## 2015-10-11 NOTE — ED Provider Notes (Signed)
CSN: [id number]     Arrival date & time 10/11/15  0022 History   First MD Initiated Contact with Patient 10/11/15 [phone]     Chief Complaint  Patient presents with  . Numbness     (Consider location/radiation/quality/duration/timing/severity/associated sxs/prior Treatment) Patient is a 80 y.o. male presenting with general illness. The history is provided by the patient.  Illness Location:  Numbness of the right lower face with difficult gait onset just PTA Severity:  Moderate Onset quality:  Sudden Timing:  Constant Progression:  Improving Chronicity:  New Context:  Bradycardia not on a rate related agent Relieved by:  Resting in bed in the room Worsened by:  Nothing Ineffective treatments:  None Associated symptoms: no abdominal pain, no chest pain, no cough, no fever, no loss of consciousness, no nausea and no wheezing   Feeling better with numbness.  Heart rate is in the 30-40s.  No beta blocker no calcium channel blocker.    Past Medical History  Diagnosis Date  . 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 DM2 (diabetes mellitus, type 2) ([place])   . HTN (hypertension)   . HSV infection   . Hyperlipidemia 06/05/2011   History reviewed. No pertinent past surgical history. History reviewed. No pertinent family history. Social History  Substance Use Topics  . Smoking status: Former Smoker    Types: Cigarettes    Quit date: 05/28/1983  . Smokeless tobacco: Never Used  . Alcohol Use: No    Review of Systems  Constitutional: Negative for fever.  Respiratory: Negative for cough and wheezing.   Cardiovascular: Negative for chest pain, palpitations and leg swelling.  Gastrointestinal: Negative for nausea and abdominal pain.  Musculoskeletal: Positive for gait problem.  Neurological: Positive for numbness. Negative for loss of consciousness, speech difficulty and weakness.  All other systems reviewed and are negative.     Allergies  Review of  patient's allergies indicates no known allergies.  Home Medications   Prior to Admission medications   Medication Sig Start Date End Date Taking? Authorizing Provider  amLODipine (NORVASC) 5 MG tablet Take 1 tablet (5 mg total) by mouth daily. 12/29/14   Kristen N Ward, DO  atenolol (TENORMIN) 25 MG tablet Take 25 mg by mouth daily.    Historical Provider, MD  cholecalciferol (VITAMIN D) 1000 UNITS tablet Take 1,000 Units by mouth daily.    Historical Provider, MD  ezetimibe-simvastatin (VYTORIN) 10-40 MG per tablet Take 1 tablet by mouth at bedtime.    Historical Provider, MD  hydrOXYzine (ATARAX/VISTARIL) 25 MG tablet Take 25 mg by mouth 3 (three) times daily as needed.    Historical Provider, MD  olmesartan (BENICAR) 40 MG tablet Take 40 mg by mouth daily.    Historical Provider, MD  RABEprazole (ACIPHEX) 20 MG tablet Take 20 mg by mouth daily.    Historical Provider, MD   There were no vitals taken for this visit. Physical Exam  Constitutional: He is oriented to person, place, and time. He appears well-developed and well-nourished. No distress.  HENT:  Head: Normocephalic and atraumatic.  Mouth/Throat: Oropharynx is clear and moist.  Eyes: Conjunctivae and EOM are normal. Pupils are equal, round, and reactive to light.  Neck: Normal range of motion. Neck supple. No JVD present.  Cardiovascular: Regular rhythm and intact distal pulses.  Bradycardia present.   Pulmonary/Chest: Effort normal and breath sounds normal. No respiratory distress. He has no wheezes. He has no rales.  Abdominal: Soft. Bowel sounds are  normal. There is no tenderness. There is no rebound and no guarding.  Musculoskeletal: Normal range of motion.  Lymphadenopathy:    He has no cervical adenopathy.  Neurological: He is alert and oriented to person, place, and time. He has normal reflexes.  NIH 2 on arrival  Skin: Skin is warm and dry.  Psychiatric: He has a normal mood and affect.    ED Course  Procedures  (including critical care time) Labs Review Labs Reviewed  CBC WITH DIFFERENTIAL/PLATELET - Abnormal; Notable for the following:    HCT 38.3 (*)    MCV 72.5 (*)    MCH 72.5 (*)    MCHC 36.8 (*)    All other components within normal limits  COMPREHENSIVE METABOLIC PANEL - Abnormal; Notable for the following:    Glucose, Bld 117 (*)    BUN 22 (*)    Creatinine, Ser 1.94 (*)    ALT 14 (*)    GFR calc non Af Amer 32 (*)    GFR calc Af Amer 37 (*)    All other components within normal limits  PROTIME-INR - Abnormal; Notable for the following:    Prothrombin Time 16.0 (*)    All other components within normal limits  CBG MONITORING, ED - Abnormal; Notable for the following:    Glucose-Capillary 108 (*)    All other components within normal limits  TROPONIN I  APTT    Imaging Review No results found. I have personally reviewed and evaluated these images and lab results as part of my medical decision-making.   EKG Interpretation None      MDM   Final diagnoses:  Symptomatic bradycardia  Mobitz type 2 second degree AV block    Results for orders placed or performed during the hospital encounter of 10/11/15  CBC with Differential/Platelet  Result Value Ref Range   WBC 8.4 4.0 - 10.5 K/uL   RBC 5.28 4.22 - 5.81 MIL/uL   Hemoglobin 14.1 13.0 - 17.0 g/dL   HCT 38.3 (L) 39.0 - 52.0 %   MCV 72.5 (L) 78.0 - 100.0 fL   MCH 72.5 (H) 26.0 - 34.0 pg   MCHC 36.8 (H) 30.0 - 36.0 g/dL   RDW 15.1 11.5 - 15.5 %   Platelets 163 150 - 400 K/uL   Neutrophils Relative % 41 %   Neutro Abs 3.5 1.7 - 7.7 K/uL   Lymphocytes Relative 45 %   Lymphs Abs 3.8 0.7 - 4.0 K/uL   Monocytes Relative 12 %   Monocytes Absolute 1.0 0.1 - 1.0 K/uL   Eosinophils Relative 2 %   Eosinophils Absolute 0.1 0.0 - 0.7 K/uL   Basophils Relative 0 %   Basophils Absolute 0.0 0.0 - 0.1 K/uL  Comprehensive metabolic panel  Result Value Ref Range   Sodium 138 135 - 145 mmol/L   Potassium 3.9 3.5 - 5.1 mmol/L    Chloride 108 101 - 111 mmol/L   CO2 24 22 - 32 mmol/L   Glucose, Bld 117 (H) 65 - 99 mg/dL   BUN 22 (H) 6 - 20 mg/dL   Creatinine, Ser 1.94 (H) 0.61 - 1.24 mg/dL   Calcium 8.9 8.9 - 10.3 mg/dL   Total Protein 6.9 6.5 - 8.1 g/dL   Albumin 3.9 3.5 - 5.0 g/dL   AST 20 15 - 41 U/L   ALT 14 (L) 17 - 63 U/L   Alkaline Phosphatase 38 38 - 126 U/L   Total Bilirubin 0.7 0.3 - 1.2 mg/dL  GFR calc non Af Amer 32 (L) >60 mL/min   GFR calc Af Amer 37 (L) >60 mL/min   Anion gap 6 5 - 15  Troponin I  Result Value Ref Range   Troponin I <0.03 <0.031 ng/mL  Protime-INR  Result Value Ref Range   Prothrombin Time 16.0 (H) 11.6 - 15.2 seconds   INR 1.27 0.00 - 1.49  APTT  Result Value Ref Range   aPTT 32 24 - 37 seconds  CBG monitoring, ED  Result Value Ref Range   Glucose-Capillary 108 (H) 65 - 99 mg/dL   No results found.  ED ECG REPORT   Date: 10/11/2015  Rate: 41  Rhythm: sinus bradycardia  QRS Axis: normal  Intervals: PR shortened  ST/T Wave abnormalities: normal  Conduction Disutrbances:second-degree A-V block, ( Mobitz II )  Narrative Interpretation:   Old EKG Reviewed: none available  I have personally reviewed the EKG tracing and agree with the computerized printout as noted.  Case D/w neurology no TPA, admit to medicine or cardiology will consult  Case d/w Dr. Jeffie Pollock who will accept patient to telemetry  All transmission of EKG done through patient's own cell phone to protect HIPAA   IV bolus given for increased creatinine 1.94  Additional documentation on paper including vitals as patient arrived and was transferred to [facility] during [facility] downtime  Krishay Faro, MD 10/11/15 [id number]

## 2015-10-11 NOTE — Consult Note (Signed)
ELECTROPHYSIOLOGY CONSULT NOTE    Patient ID: Travis Mercado MRN: [id number], DOB/AGE: 1935/08/23 80 y.o.  Admit date: 10/11/2015 Date of Consult: 10/11/2015  Primary Physician: Dwan Bolt, MD Primary Cardiologist: new to [place] Referring Physician: [place]   Reason for Consultation: 2:1 heart block   HPI:  Travis Mercado is a 80 y.o. male with a past medical history significant for diabetes, hypertension, hyperlipidemia, and chronic kidney disease. While at home yesterday evening, he developed right facial droop and gait instability.  He was seen at [facility] where code stroke was activated.  His symptoms resolved quickly.  CT of the scan showed no acute process.  EKG demonstrated complete heart block and he was transferred to [facility] for further evaluation. He had similar symptoms several months ago at the [facility] and at that time wore a monitor which demonstrated bradycardia and his atenolol was discontinued. His symptoms then improved.   He has been seen by neurology and is undergoing stroke work up.  Echo is pending. TSH normal.  EP has been asked to evaluate for treatment options.   He currently denies chest pain, shortness of breath, recent fevers, chills, nausea or vomiting. He has not had syncope, pre-syncope or dizziness.    Past Medical History  Diagnosis Date  . 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 DM2 (diabetes mellitus, type 2) ([place])   . HTN (hypertension)   . HSV infection   . Hyperlipidemia 06/05/2011     Surgical History: History reviewed. No pertinent past surgical history.   Prescriptions prior to admission  Medication Sig Dispense Refill Last Dose  . amLODipine (NORVASC) 5 MG tablet Take 1 tablet (5 mg total) by mouth daily. 30 tablet 0   . atenolol (TENORMIN) 25 MG tablet Take 25 mg by mouth daily.   12/28/2014 at Unknown time  . cholecalciferol (VITAMIN D) 1000 UNITS tablet Take 1,000 Units by  mouth daily.   12/28/2014 at Unknown time  . ezetimibe-simvastatin (VYTORIN) 10-40 MG per tablet Take 1 tablet by mouth at bedtime.   12/28/2014 at Unknown time  . hydrOXYzine (ATARAX/VISTARIL) 25 MG tablet Take 25 mg by mouth 3 (three) times daily as needed.   12/28/2014 at Unknown time  . olmesartan (BENICAR) 40 MG tablet Take 40 mg by mouth daily.   12/28/2014 at Unknown time  . RABEprazole (ACIPHEX) 20 MG tablet Take 20 mg by mouth daily.       Inpatient Medications:  . enoxaparin (LOVENOX) injection  30 mg Subcutaneous Daily  . ezetimibe-simvastatin  1 tablet Oral QHS  . insulin aspart  0-9 Units Subcutaneous TID WC  . pantoprazole  40 mg Oral Q1200    Allergies: No Known Allergies  Social History   Social History  . Marital Status: Married    Spouse Name: N/A  . Number of Children: N/A  . Years of Education: N/A   Occupational History  . Not on file.   Social History Main Topics  . Smoking status: Former Smoker    Types: Cigarettes    Quit date: 05/28/1983  . Smokeless tobacco: Never Used  . Alcohol Use: No  . Drug Use: No  . Sexual Activity: Not on file   Other Topics Concern  . Not on file   Social History Narrative     Family History: no premature CAD    Review of Systems: All other systems reviewed and are otherwise negative except as  noted above.  Physical Exam: Filed Vitals:   10/11/15 0325  BP: 147/61  Pulse: 34  Temp: 97.5 F (36.4 C)  TempSrc: Oral  Resp: 18  Weight: 213 lb 13.5 oz (97 kg)  SpO2: 100%    GEN- The patient is well appearing, alert and oriented x 3 today.   HEENT: normocephalic, atraumatic; sclera clear, conjunctiva pink; hearing intact; oropharynx clear; neck supple  Lungs- Clear to ausculation bilaterally, normal work of breathing.  No wheezes, rales, rhonchi Heart- Bradycardic regular rate and rhythm GI- soft, non-tender, non-distended, bowel sounds present  Extremities- no clubbing, cyanosis, or edema; DP/PT/radial pulses 2+  bilaterally MS- no significant deformity or atrophy Skin- warm and dry, no rash or lesion Psych- euthymic mood, full affect Neuro- strength and sensation are intact  Labs:   Lab Results  Component Value Date   WBC 7.0 10/11/2015   HGB 12.7* 10/11/2015   HCT 36.8* 10/11/2015   MCV 74.9* 10/11/2015   PLT 149* 10/11/2015    Recent Labs Lab 10/11/15 0040 10/11/15 0525  NA 138  --   K 3.9  --   CL 108  --   CO2 24  --   BUN 22*  --   CREATININE 1.94* 1.63*  CALCIUM 8.9  --   PROT 6.9  --   BILITOT 0.7  --   ALKPHOS 38  --   ALT 14*  --   AST 20  --   GLUCOSE 117*  --       Radiology/Studies: Ct Head Code Stroke W/o Cm 10/11/2015  CLINICAL DATA:  Code stroke. Right facial droop for 1 hour. Unsteady gait. History of hypertension. EXAM: CT HEAD WITHOUT CONTRAST TECHNIQUE: Contiguous axial images were obtained from the base of the skull through the vertex without intravenous contrast. COMPARISON:  None. FINDINGS: Ventricles and sulci appear symmetrical. No mass effect or midline shift. No ventricular dilatation. Mild cerebral atrophy. Gray-white matter junctions are distinct. Basal cisterns are not effaced. No abnormal extra-axial fluid collections. No evidence of acute intracranial hemorrhage. Calvarium appears intact. Mild mucosal thickening in the paranasal sinuses. Mastoid air cells are not opacified. Vascular calcifications. IMPRESSION: No acute intracranial abnormalities. Code stroke results were called by telephone at 1:20 a.m. on 10/11/2015 to Dr. Randal Buba. Electronically Signed   By: Lucienne Capers M.D.   On: 10/11/2015 04:42    EKG: complete heart block with narrow ventricular escape  TELEMETRY: complete heart block, ventricular rates 30's  Assessment/Plan: 1.  Complete heart block The patient has documented complete heart block with no reversible causes identified. PPM implantation is recommended at this time.  Risks, benefits reviewed with patient and wife who agree to  proceed. Travis Mercado plan for later today after neuro work up complete With ?TIA Travis Mercado plan on MRI compatible device Echo this morning  2.  ?TIA Work up per neurology  3.  HTN Stable No change required today   Signed, Chanetta Marshall, NP 10/11/2015 7:27 AM   I have seen and examined this patient with Chanetta Marshall.  Agree with above, note added to reflect my findings.  On exam, regular rhythm, bradycardic, no murmurs, lungs clear.  Presented to the hospital with stroke like symptoms with a negative CT scan.  Was found to be in complete heart block with HR in the 30s and a narrow complex.  Is going to have an MRI today to look for stroke and a TTE to further evaluate cardiac function.  Pending the results, Travis Mercado likely place a  pacemaker today.  Discussed the risks and benefits with the patient.  Risks include bleeding, infection, tamponade, and pneumothorax.  Patient understands the risks and has agreed to the pacemaker implant.      Travis Mercado M. Lesleigh Hughson MD 10/11/2015 8:09 AM

## 2015-10-11 NOTE — Progress Notes (Signed)
STROKE TEAM PROGRESS NOTE   HISTORY OF PRESENT ILLNESS Travis Mercado is an 80 yo male who presented to OSH with R lower face numbness that resolved after 30 min. 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 As per cardiology will have PM today.  Patient was not administered IV t-PA.    SUBJECTIVE (INTERVAL HISTORY) His wife is at the bedside.  Overall he feels his condition is completely resolved. He stated that he has 30 min episode of only right cheek perioral region numbness. No other associated symptoms. Denies HA or migraine hx.    OBJECTIVE Temp:  [97.5 F (36.4 C)] 97.5 F (36.4 C) (05/17 1630) Pulse Rate:  [32-171] 59 (05/17 1730) Cardiac Rhythm:  [-] A-V Sequential paced (05/17 1624) Resp:  [0-40] 16 (05/17 1630) BP: (120-188)/(61-97) 170/90 mmHg (05/17 1730) SpO2:  [97 %-100 %] 100 % (05/17 1630) Weight:  [213 lb 13.5 oz (97 kg)] 213 lb 13.5 oz (97 kg) (05/17 0325)  CBC:   Recent Labs Lab 10/11/15 0040 10/11/15 0525  WBC 8.4 7.0  NEUTROABS 3.5  --   HGB 14.1 12.7*  HCT 38.3* 36.8*  MCV 72.5* 74.9*  PLT 163 149*    Basic Metabolic Panel:   Recent Labs Lab 10/11/15 0040 10/11/15 0525  NA 138  --   K 3.9  --   CL 108  --   CO2 24  --   GLUCOSE 117*  --   BUN 22*  --   CREATININE 1.94* 1.63*  CALCIUM 8.9  --     Lipid Panel:     Component Value Date/Time   CHOL 132 10/11/2015 0525   TRIG 80 10/11/2015 0525   HDL 57 10/11/2015 0525   CHOLHDL 2.3 10/11/2015 0525   VLDL 16 10/11/2015 0525   LDLCALC 59 10/11/2015 0525   HgbA1c: No results found for: HGBA1C Urine Drug Screen: No results found for: LABOPIA, COCAINSCRNUR, LABBENZ, AMPHETMU, THCU, LABBARB    IMAGING  Mri and Mra Head and neck Wo Contrast 10/11/2015  IMPRESSION: 1. Positive for high-grade stenosis in the left vertebral artery distal V4 segment, and positive for right PCA occlusion in the P2 segment. 2. However, no associated  acute infarct in the brain. 3. There are small chronic lacunar infarcts in both cerebellar hemispheres. There are mild to moderate for age signal changes in the cerebral white matter and pons compatible with chronic small vessel disease. 4. Suboptimal neck MRA imaging with no definite hemodynamically significant carotid or vertebral artery stenosis in the neck. 5. Fairly advanced anterior circulation intracranial atherosclerosis including significant stenoses of the left supraclinoid ICA and both ACA origins. Mild stenosis at both MCA origins.   Ct Head Code Stroke W/o Cm 10/11/2015  IMPRESSION: No acute intracranial abnormalities.   TTE - - Left ventricle: The cavity size was normal. Wall thickness was  normal. Systolic function was normal. The estimated ejection  fraction was in the range of 60% to 65%. Wall motion was normal;  there were no regional wall motion abnormalities. Doppler  parameters are consistent with abnormal left ventricular  relaxation (grade 1 diastolic dysfunction). - Aortic valve: Trileaflet; moderately thickened, moderately  calcified leaflets. - Mitral valve: There was trivial regurgitation. - Tricuspid valve: There was mild regurgitation.   PHYSICAL EXAM  Temp:  [97.5 F (36.4 C)] 97.5 F (36.4 C) (05/17 1630) Pulse Rate:  [32-171] 59 (05/17 1730) Resp:  [0-40] 16 (05/17 1630) BP: (120-188)/(61-97)  170/90 mmHg (05/17 1730) SpO2:  [97 %-100 %] 100 % (05/17 1630) Weight:  [213 lb 13.5 oz (97 kg)] 213 lb 13.5 oz (97 kg) (05/17 0325)  General - Well nourished, well developed, in no apparent distress.  Ophthalmologic - Fundi not visualized due to .  Cardiovascular - Regular rhythm but significant bradycardia.  Mental Status -  Level of arousal and orientation to time, place, and person were intact. Language including expression, naming, repetition, comprehension was assessed and found intact. Fund of Knowledge was assessed and was intact.  Cranial  Nerves II - XII - II - Visual field intact OU. III, IV, VI - Extraocular movements intact. V - Facial sensation intact bilaterally. VII - Facial movement intact bilaterally. VIII - Hearing & vestibular intact bilaterally. X - Palate elevates symmetrically. XI - Chin turning & shoulder shrug intact bilaterally. XII - Tongue protrusion intact.  Motor Strength - The patient's strength was normal in all extremities and pronator drift was absent.  Bulk was normal and fasciculations were absent.   Motor Tone - Muscle tone was assessed at the neck and appendages and was normal.  Reflexes - The patient's reflexes were 1+ in all extremities and he had no pathological reflexes.  Sensory - Light touch, temperature/pinprick were assessed and were symmetrical.    Coordination - The patient had normal movements in the hands and feet with no ataxia or dysmetria.  Tremor was absent.  Gait and Station - deferred due to safety concerns.   ASSESSMENT/PLAN Mr. Travis Mercado is a 80 y.o. male presenting to [facility] with history of HTN, HLD, DM2, CKD, and small AAA presenting with right facial weakness and gait instability as well as second degree heart block and symptomatic bradycardia. He did not receive IV t-PA.   Possible left brain TIA likely due to intracranial atherosclerosis  Resultant  Deficit resolved  MRI with and without contrast no acute stroke  MRA Head chronically occluded right PCA and high grade left V4, as well as diffuse intracranial athrosclerosis  MRA neck  Negative  2D Echo  EF 60-65%  LDL 59  HgbA1c pending  Lovenox 30 mg sq daily for VTE prophylaxis  Diet Heart Room service appropriate?: Yes; Fluid consistency:: Thin.   No antithrombotic prior to admission, now on No antithrombotic. Recommend ASA for stroke prevention.  Patient counseled to be compliant with his antithrombotic medications  Ongoing aggressive stroke risk factor management  If imaging  positive for stroke, add PT and OT evals  Therapy recommendations:  pending  Disposition:  Pending  Bradycardia  Plan for pacemaker placement  Recommend ASA   Hypertension  Stable  Hyperlipidemia  Home meds:  vytorin, resumed in hospital  LDL 59, goal < 70  Continue vytorin at discharge  Diabetes  HgbA1c pending, goal < 7.0  SSI  Other Stroke Risk Factors  Advanced age  Former Cigarette smoker, quit smoking years ago   Obesity, Body mass index is 34.53 kg/(m^2).   Other Active Problems  Complete heart block  symptomatic bradycardia  4.4 cm AAA followed up with [facility] - conservative treatment  Acute on CKD stage III  Hospital day # 0  Neurology will sign off. Please call with questions. Thanks for the consult.  Rosalin Hawking, MD PhD Stroke Neurology 10/11/2015 5:57 PM   To contact Stroke Continuity provider, please refer to [email or web address] After hours, contact General Neurology

## 2015-10-12 ENCOUNTER — Encounter (HOSPITAL_COMMUNITY): Payer: Self-pay | Admitting: Cardiology

## 2015-10-12 ENCOUNTER — Inpatient Hospital Stay (HOSPITAL_COMMUNITY): Payer: Medicare Other

## 2015-10-12 DIAGNOSIS — R29810 Facial weakness: Secondary | ICD-10-CM

## 2015-10-12 LAB — GLUCOSE, CAPILLARY
GLUCOSE-CAPILLARY: 121 mg/dL — AB (ref 65–99)
GLUCOSE-CAPILLARY: 145 mg/dL — AB (ref 65–99)
Glucose-Capillary: 189 mg/dL — ABNORMAL HIGH (ref 65–99)

## 2015-10-12 LAB — HEMOGLOBIN A1C
Hgb A1c MFr Bld: 6.4 % — ABNORMAL HIGH (ref 4.8–5.6)
Mean Plasma Glucose: 137 mg/dL

## 2015-10-12 MED ORDER — ASPIRIN 81 MG PO TBEC: 81.0000 mg | DELAYED_RELEASE_TABLET | Freq: Every day | ORAL | Status: AC

## 2015-10-12 MED ORDER — YOU HAVE A PACEMAKER BOOK
Freq: Once | Status: AC
  Administered 2015-10-12: 08:00:00
  Filled 2015-10-12: qty 1

## 2015-10-12 NOTE — Progress Notes (Signed)
Discharge education completed.  Pt denies chest pain or sob.  Pt stable at discharge.

## 2015-10-12 NOTE — Progress Notes (Signed)
Patient states h.s. accu ck,. Was done but no results in computer

## 2015-10-12 NOTE — Discharge Summary (Signed)
ELECTROPHYSIOLOGY PROCEDURE DISCHARGE SUMMARY    Patient ID: Travis Mercado,  MRN: [id number], DOB/AGE: 1935/06/05 80 y.o.  Admit date: 10/11/2015 Discharge date: 10/12/2015  Primary Care Physician: Dwan Bolt, MD Electrophysiologist: [facility]  Primary Discharge Diagnosis:  1. Symptomatic complete heart block status post pacemaker implantation this admission 2. Possible TIA  Secondary Discharge Diagnosis:  1.  Hypertension 2.  Hyperlipidemia 3.  CKD 4.  Diabetes  No Known Allergies   Procedures This Admission:  1.  Implantation of a MDT dual chamber PPM on 10/11/15 by Dr Curt Bears.  The patient received a MDT model number Advisa PPM with model number 5076 right atrial lead and 5076 right ventricular lead. There were no immediate post procedure complications. 2.  CXR on 10/12/15 demonstrated no pneumothorax status post device implantation.   Brief HPI/Hospital Course:  Travis Mercado is a 80 y.o. male with a past medical history as outlined above. He developed right facial numbness on the day of admission and presented to the ER where he was found to be in CHB.  CT scan of the head was negative for acute findings.  MRI demonstrated chronically occluded right PCA and high grade left V4 as well as diffuse intracranial athrosclerosis.  He was seen by neurology who reocmmended daily ASA at discharge.  Due to symptomatic bradycardia without reversible causes identified, risks, benefits, and alternatives to PPM implantation were reviewed with the patient who wished to proceed.   The patient underwent implantation of a MDT dual chamber pacemaker with details as outlined above.  He  was monitored on telemetry overnight which demonstrated AV pacing.  Left chest was without hematoma or ecchymosis.  The device was interrogated and found to be functioning normally.  CXR was obtained and demonstrated no pneumothorax status post device implantation.  Wound care, arm mobility, and  restrictions were reviewed with the patient.  The patient was examined and considered stable for discharge to home.    Physical Exam: Filed Vitals:   10/11/15 2045 10/12/15 0022 10/12/15 0600 10/12/15 0605  BP: 156/89 169/92  134/78  Pulse: 60 60  59  Temp: 98.3 F (36.8 C) 98 F (36.7 C)  98.4 F (36.9 C)  TempSrc: Oral Oral  Oral  Resp:  16  18  Height:   5\' 6"  (1.676 m)   Weight:    207 lb 8 oz (94.121 kg)  SpO2: 100% 99%      GEN- The patient is well appearing, alert and oriented x 3 today.   HEENT: normocephalic, atraumatic; sclera clear, conjunctiva pink; hearing intact; oropharynx clear; neck supple  Lungs- Clear to ausculation bilaterally, normal work of breathing.  No wheezes, rales, rhonchi Heart- Regular rate and rhythm  GI- soft, non-tender, non-distended, bowel sounds present  Extremities- no clubbing, cyanosis, or edema; DP/PT/radial pulses 2+ bilaterally MS- no significant deformity or atrophy Skin- warm and dry, no rash or lesion, left chest without hematoma/ecchymosis Psych- euthymic mood, full affect Neuro- strength and sensation are intact   Labs:   Lab Results  Component Value Date   WBC 7.0 10/11/2015   HGB 12.7* 10/11/2015   HCT 36.8* 10/11/2015   MCV 74.9* 10/11/2015   PLT 149* 10/11/2015     Recent Labs Lab 10/11/15 0040 10/11/15 0525  NA 138  --   K 3.9  --   CL 108  --   CO2 24  --   BUN 22*  --   CREATININE 1.94* 1.63*  CALCIUM  8.9  --   PROT 6.9  --   BILITOT 0.7  --   ALKPHOS 38  --   ALT 14*  --   AST 20  --   GLUCOSE 117*  --     Discharge Medications:    Medication List    TAKE these medications        amLODipine 10 MG tablet  Commonly known as:  NORVASC  Take 10 mg by mouth daily.     aspirin 81 MG EC tablet  Take 1 tablet (81 mg total) by mouth daily.     cholecalciferol 1000 units tablet  Commonly known as:  VITAMIN D  Take 1,000 Units by mouth daily.     ezetimibe-simvastatin 10-40 MG tablet  Commonly  known as:  VYTORIN  Take 1 tablet by mouth at bedtime.     hydrOXYzine 25 MG tablet  Commonly known as:  ATARAX/VISTARIL  Take 25 mg by mouth 3 (three) times daily as needed.     losartan 100 MG tablet  Commonly known as:  COZAAR  Take 100 mg by mouth daily.        Disposition:  Discharge Instructions    Diet - low sodium heart healthy    Complete by:  As directed      Increase activity slowly    Complete by:  As directed           Follow-up Information    Follow up with [facility] On 10/25/2015.   Specialty:  Cardiology   Why:  at [facility] for wound check    Contact information:   [address] [place] [phone]      Follow up with Kym Fenter Meredith Leeds, MD On 01/11/2016.   Specialty:  Cardiology   Why:  at 8:45AM    Contact information:   [address] [place] [address] [phone]       Follow up with Dwan Bolt, MD. Schedule an appointment as soon as possible for a visit in 1 week.   Specialty:  Endocrinology   Contact information:   [address] [place] [address] [phone]       Duration of Discharge Encounter: Greater than 30 minutes including physician time.  Signed, Chanetta Marshall, NP 10/12/2015 7:28 AM   I have seen and examined this patient with Chanetta Marshall.  Agree with above, note added to reflect my findings.  On exam, regular rhythm, no murmurs, lungs clear.  Presented to the hospital with left facial tingling and complete heart block.  Had MRI showing no acute stroke but did have significant intercranial stenosis and was started on aspirin.  Had dual chamber pacemaker placed for heart block.  Isobel Eisenhuth follow up in device clinic in 10 days for wound check and device interrogation.    Glendon Fiser M. Orestes Geiman MD 10/12/2015 8:05 AM

## 2015-10-12 NOTE — Progress Notes (Addendum)
Order received to discharge.  Telemetry removed and CCMD notified.  IV removed with catheter intact.  Discharge education given to Pt with wife at bedside.  Pt given book "You have a Pacemaker".  Pt and wife watched pacemaker educational video.  Pt denies pain at this time.

## 2015-10-12 NOTE — Care Management Note (Signed)
Case Management Note Marvetta Gibbons RN, BSN Unit 2W-Case Manager [phone]  Patient Details  Name: Travis Mercado MRN: [id number] Date of Birth: 11-Aug-1935  Subjective/Objective:   Pt admitted with CHB s/p PPM                 Action/Plan: PTA pt lived at home - plan to return home- no CM needs noted  Expected Discharge Date:     10/12/15             Expected Discharge Plan:  Home/Self Care  In-House Referral:     Discharge planning Services  CM Consult  Post Acute Care Choice:    Choice offered to:     DME Arranged:    DME Agency:     HH Arranged:    [place] Agency:     Status of Service:  Completed, signed off  Medicare Important Message Given:    Date Medicare IM Given:    Medicare IM give by:    Date Additional Medicare IM Given:    Additional Medicare Important Message give by:     If discussed at [place] of Stay Meetings, dates discussed:    Additional Comments:  Dawayne Patricia, RN 10/12/2015, 10:27 AM

## 2015-10-12 NOTE — Discharge Instructions (Signed)
° ° °  Supplemental Discharge Instructions for  Pacemaker/Defibrillator Patients  Activity No heavy lifting or vigorous activity with your left/right arm for 6 to 8 weeks.  Do not raise your left/right arm above your head for one week.  Gradually raise your affected arm as drawn below.           __        10/16/15                      10/17/15                       10/18/15                10/19/15  NO DRIVING for   1 week  ; you may begin driving on  [id number]   .  WOUND CARE - Keep the wound area clean and dry.  Do not get this area wet for one week. No showers for one week; you may shower on  10/19/15   . - The tape/steri-strips on your wound will fall off; do not pull them off.  No bandage is needed on the site.  DO  NOT apply any creams, oils, or ointments to the wound area. - If you notice any drainage or discharge from the wound, any swelling or bruising at the site, or you develop a fever > 101? F after you are discharged home, call the office at once.  Special Instructions - You are still able to use cellular telephones; use the ear opposite the side where you have your pacemaker/defibrillator.  Avoid carrying your cellular phone near your device. - When traveling through airports, show security personnel your identification card to avoid being screened in the metal detectors.  Ask the security personnel to use the hand wand. - Avoid arc welding equipment, TENS units (transcutaneous nerve stimulators).  Call the office for questions about other devices. - Avoid electrical appliances that are in poor condition or are not properly grounded. - Microwave ovens are safe to be near or to operate.

## 2015-10-18 ENCOUNTER — Other Ambulatory Visit: Payer: BLUE CROSS/BLUE SHIELD

## 2015-10-18 ENCOUNTER — Ambulatory Visit: Payer: BLUE CROSS/BLUE SHIELD | Admitting: Cardiothoracic Surgery

## 2015-10-25 ENCOUNTER — Encounter: Payer: Self-pay | Admitting: Cardiology

## 2015-10-25 ENCOUNTER — Ambulatory Visit (INDEPENDENT_AMBULATORY_CARE_PROVIDER_SITE_OTHER): Payer: Medicare Other | Admitting: *Deleted

## 2015-10-25 DIAGNOSIS — I441 Atrioventricular block, second degree: Secondary | ICD-10-CM

## 2015-10-25 DIAGNOSIS — R001 Bradycardia, unspecified: Secondary | ICD-10-CM

## 2015-10-26 LAB — CUP PACEART INCLINIC DEVICE CHECK
Battery Remaining Longevity: 104 mo
Brady Statistic AP VP Percent: 14.26 %
Brady Statistic AP VS Percent: 0 %
Brady Statistic AS VS Percent: 0.01 %
Brady Statistic RA Percent Paced: 14.26 %
Brady Statistic RV Percent Paced: 99.99 %
Implantable Lead Implant Date: 20170517
Implantable Lead Location: 753860
Lead Channel Impedance Value: 437 Ohm
Lead Channel Impedance Value: 494 Ohm
Lead Channel Pacing Threshold Amplitude: 0.75 V
Lead Channel Pacing Threshold Amplitude: 0.75 V
Lead Channel Pacing Threshold Pulse Width: 0.4 ms
Lead Channel Sensing Intrinsic Amplitude: 12.5 mV
Lead Channel Setting Pacing Amplitude: 3.5 V
Lead Channel Setting Pacing Pulse Width: 0.4 ms
MDC IDC LEAD IMPLANT DT: 20170517
MDC IDC LEAD LOCATION: 753859
MDC IDC MSMT BATTERY VOLTAGE: 3.11 V
MDC IDC MSMT LEADCHNL RA IMPEDANCE VALUE: 380 Ohm
MDC IDC MSMT LEADCHNL RA SENSING INTR AMPL: 1 mV
MDC IDC MSMT LEADCHNL RV IMPEDANCE VALUE: 418 Ohm
MDC IDC MSMT LEADCHNL RV PACING THRESHOLD PULSEWIDTH: 0.4 ms
MDC IDC SESS DTM: 20170531202911
MDC IDC SET LEADCHNL RV PACING AMPLITUDE: 3.5 V
MDC IDC SET LEADCHNL RV SENSING SENSITIVITY: 2.8 mV
MDC IDC STAT BRADY AS VP PERCENT: 85.73 %

## 2015-10-26 NOTE — Progress Notes (Signed)
Wound check appointment. Steri-strips removed. Wound without redness or edema. Incision edges approximated, wound well healed. Normal device function. Thresholds, sensing, and impedances consistent with implant measurements. Device programmed at 3.5V/auto capture programmed on for extra safety margin until 3 month visit. Histogram distribution appropriate for patient and level of activity. 4 AT/AF episodes--false, FFRW, reprogrammed PVAB method to Partial +. No high ventricular rates noted. Patient educated about wound care, arm mobility, lifting restrictions. ROV in 3 months with WC.

## 2015-11-01 ENCOUNTER — Ambulatory Visit
Admit: 2015-11-01 | Discharge: 2015-11-01 | Disposition: A | Discharge status: Home / Self Care | Payer: Medicare Other | Attending: Cardiothoracic Surgery | Admitting: Cardiothoracic Surgery

## 2015-11-01 ENCOUNTER — Ambulatory Visit (INDEPENDENT_AMBULATORY_CARE_PROVIDER_SITE_OTHER): Payer: Medicare Other | Admitting: Cardiothoracic Surgery

## 2015-11-01 VITALS — BP 159/102 | HR 79 | Resp 16 | Ht 66.0 in | Wt 214.0 lb

## 2015-11-01 DIAGNOSIS — I712 Thoracic aortic aneurysm, without rupture, unspecified: Secondary | ICD-10-CM

## 2015-11-01 DIAGNOSIS — I7121 Aneurysm of the ascending aorta, without rupture: Secondary | ICD-10-CM

## 2015-11-01 DIAGNOSIS — I10 Essential (primary) hypertension: Secondary | ICD-10-CM

## 2015-11-01 MED ORDER — IOPAMIDOL (ISOVUE-300) INJECTION 61%
50.0000 mL | Freq: Once | INTRAVENOUS | Status: AC | PRN
  Administered 2015-11-01: 50 mL via INTRAVENOUS

## 2015-11-01 NOTE — Progress Notes (Signed)
PCP is Dwan Bolt, MD Referring Provider is Anda Kraft, MD  Chief Complaint  Patient presents with  . Follow-up    TAA .Marland Kitchen.15 months with CT CHEST, instead of MRA D/T NEW PACEMAKER    [id number] returns for scheduled visit with CTA of the thoracic aorta.        The patient is followed with regular scans for a 4.5 cm fusiform ascending thoracic aneurysm first noted in 2013. This is asymptomatic. He has hypertension. He is currently taking losartan and amlodipine. Review of his blood pressures the past 6 weeks shows persistent elevation 160/100 range. The patient recently had a transvenous pacemaker placed for 2:1 heart block and symptomatic presyncope.he feels much better after the pacemaker. Patient may need to resume a beta blocker for blood pressure control now that his bradycardia has been treated with the pacemaker.  The patient denies any chest pain. CT images of thoracic aorta  personally Reviewed and counseled with patient. The ascending aorta remains stable at 4.5 cm. There is no mural thrombus, ulceration, or thickening. Aortic arch and descending thoracic aorta remained normal.    Past Medical History  Diagnosis Date  . 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 DM2 (diabetes mellitus, type 2) ([place])   . HTN (hypertension)   . HSV infection   . Hyperlipidemia 06/05/2011  . Third degree heart block ([place]) 09/2015  . Heart murmur   . GERD (gastroesophageal reflux disease)     Past Surgical History  Procedure Laterality Date  . Appendectomy    . Ep implantable device N/A 10/11/2015    Procedure: Pacemaker Implant;  Surgeon: Will Meredith Leeds, MD;  Location: [facility];  Service: Cardiovascular;  Laterality: N/A;    No family history on file.  Social History Social History  Substance Use Topics  . Smoking status: Former Smoker    Types: Cigarettes    Quit date: 05/28/1983  . Smokeless tobacco: Never Used  . Alcohol Use:  No    Current Outpatient Prescriptions  Medication Sig Dispense Refill  . amLODipine (NORVASC) 10 MG tablet Take 10 mg by mouth daily.    Marland Kitchen aspirin EC 81 MG EC tablet Take 1 tablet (81 mg total) by mouth daily.    . cholecalciferol (VITAMIN D) 1000 UNITS tablet Take 1,000 Units by mouth daily.    Marland Kitchen ezetimibe-simvastatin (VYTORIN) 10-40 MG per tablet Take 1 tablet by mouth at bedtime.    . hydrOXYzine (ATARAX/VISTARIL) 25 MG tablet Take 25 mg by mouth 3 (three) times daily as needed.    Marland Kitchen losartan (COZAAR) 100 MG tablet Take 100 mg by mouth daily.     No current facility-administered medications for this visit.    No Known Allergies  Review of Systems        Review of Systems :  [ y ] = yes, [  ] = no        General :  Weight gain [   ]    Weight loss  [   ]  Fatigue [  ]  Fever [  ]  Chills  [  ]                                Weakness  [  ]           HEENT    Headache [  ]  Dizziness [ resolved  after pacemaker ]  Blurred vision [  ] Glaucoma  [  ]                          Nosebleeds [  ] Painful or loose teeth [  ]        Cardiac :  Chest pain/ pressure [  ]  Resting SOB [  ] exertional SOB [  ]                        Orthopnea [  ]  Pedal edema  [  ]  Palpitations [  ] Syncope/presyncope [ ]                         Paroxysmal nocturnal dyspnea [  ]         Pulmonary : cough [  ]  wheezing [  ]  Hemoptysis [  ] Sputum [  ] Snoring [  ]                              Pneumothorax [  ]  Sleep apnea [  ]        GI : Vomiting [  ]  Dysphagia [  ]  Melena  [  ]  Abdominal pain [  ] BRBPR [  ]              Heart burn [  ]  Constipation [  ] Diarrhea  [  ] Colonoscopy [   ]        GU : Hematuria [  ]  Dysuria [  ]  Nocturia [  ] UTI's [  ]        Vascular : Claudication [  ]  Rest pain [  ]  DVT [  ] Vein stripping [  ] leg ulcers [  ]                          TIA [  ] Stroke [  ]  Varicose veins [  ]        NEURO :  Headaches  [  ] Seizures [  ] Vision changes [  ] Paresthesias [  ]                                        Seizures [  ]        Musculoskeletal :  Arthritis [  ] Gout  [  ]  Back pain [  ]  Joint pain [  ]        Skin :  Rash [  ]  Melanoma [  ] Sores [  ]        Heme : Bleeding problems [  ]Clotting Disorders [  ] Anemia [  ]Blood Transfusion [ ]         Endocrine : Diabetes [  ] Heat or Cold intolerance [  ] Polyuria [  ]excessive thirst [ ]         Psych : Depression [  ]  Anxiety [  ]  Psych hospitalizations [  ] Memory change [  ]  BP 159/102 mmHg  Pulse 79  Resp 16  Ht 5\' 6"  (1.676 m)  Wt 214 lb (97.07 kg)  BMI 34.56 kg/m2  SpO2 98% Physical Exam       Physical Exam  General: well-developed pleasant middle-aged AA male no acute distress HEENT: Normocephalic pupils equal , dentition adequate Neck: Supple without JVD, adenopathy, or bruit Chest: Clear to auscultation, symmetrical breath sounds, no rhonchi, no tenderness             or deformity Cardiovascular: Regular rate and rhythm, no murmur, no gallop, peripheral pulses             palpable in all extremities Abdomen:  Soft, nontender, no palpable mass or organomegaly Extremities: Warm, well-perfused, no clubbing cyanosis edema or tenderness,              no venous stasis changes of the legs Rectal/GU: Deferred Neuro: Grossly non--focal and symmetrical throughout Skin: Clean and dry without rash or ulceration    Diagnostic Tests: CTA thoracic aorta images personally reviewed and discussed with patient. The ascending aorta remained stable at 4.5 cm the past 4 years. No new at risk findings in the lungs or mediastinum  Impression: Stable A. Symptomatic fusiform ascending aneurysm 4.5 cm since 2013 Plan: Continue surveillance scans. Next and scheduled in 18 months. Patient understands that his blood pressure is too high and a will probably need medication change. Because he is not exposed bradycardia with the pacemaker perhaps a  beta blocker could be added by his primary care physician.  Len Childs, MD [facility] [phone]

## 2015-11-21 ENCOUNTER — Encounter: Payer: Self-pay | Admitting: Cardiology

## 2015-12-05 ENCOUNTER — Encounter: Payer: Self-pay | Admitting: Cardiology

## 2015-12-05 ENCOUNTER — Ambulatory Visit (INDEPENDENT_AMBULATORY_CARE_PROVIDER_SITE_OTHER): Payer: Medicare Other | Admitting: *Deleted

## 2015-12-05 VITALS — BP 142/86 | HR 82 | Ht 66.0 in | Wt 220.0 lb

## 2015-12-05 DIAGNOSIS — Z959 Presence of cardiac and vascular implant and graft, unspecified: Secondary | ICD-10-CM | POA: Diagnosis not present

## 2015-12-05 DIAGNOSIS — I442 Atrioventricular block, complete: Secondary | ICD-10-CM | POA: Diagnosis not present

## 2015-12-05 DIAGNOSIS — Z95818 Presence of other cardiac implants and grafts: Secondary | ICD-10-CM

## 2015-12-05 DIAGNOSIS — R001 Bradycardia, unspecified: Secondary | ICD-10-CM | POA: Diagnosis not present

## 2015-12-05 LAB — CUP PACEART INCLINIC DEVICE CHECK
Battery Remaining Longevity: 110 mo
Battery Voltage: 3.05 V
Brady Statistic AP VP Percent: 32.54 %
Brady Statistic RV Percent Paced: 99.99 %
Date Time Interrogation Session: 20170711112312
Implantable Lead Implant Date: 20170517
Implantable Lead Location: 753860
Lead Channel Impedance Value: 361 Ohm
Lead Channel Impedance Value: 532 Ohm
Lead Channel Pacing Threshold Pulse Width: 0.4 ms
Lead Channel Sensing Intrinsic Amplitude: 2.75 mV
Lead Channel Setting Pacing Amplitude: 2 V
Lead Channel Setting Pacing Pulse Width: 0.4 ms
Lead Channel Setting Sensing Sensitivity: 2.8 mV
MDC IDC LEAD IMPLANT DT: 20170517
MDC IDC LEAD LOCATION: 753859
MDC IDC MSMT LEADCHNL RA IMPEDANCE VALUE: 437 Ohm
MDC IDC MSMT LEADCHNL RA PACING THRESHOLD AMPLITUDE: 0.625 V
MDC IDC MSMT LEADCHNL RV IMPEDANCE VALUE: 437 Ohm
MDC IDC MSMT LEADCHNL RV PACING THRESHOLD AMPLITUDE: 0.625 V
MDC IDC MSMT LEADCHNL RV PACING THRESHOLD PULSEWIDTH: 0.4 ms
MDC IDC MSMT LEADCHNL RV SENSING INTR AMPL: 15.375 mV
MDC IDC SET LEADCHNL RV PACING AMPLITUDE: 2.5 V
MDC IDC STAT BRADY AP VS PERCENT: 0 %
MDC IDC STAT BRADY AS VP PERCENT: 67.44 %
MDC IDC STAT BRADY AS VS PERCENT: 0.01 %
MDC IDC STAT BRADY RA PERCENT PACED: 32.54 %

## 2015-12-05 NOTE — Progress Notes (Signed)
Pacemaker check in clinic. Normal device function. Thresholds, sensing, impedances consistent with previous measurements. Device programmed to maximize longevity. 3 atrial high rate episodes, appear to be ventricular oversensing, atrial sensitivity reprogrammed to 0.34mV. Atrial amplitude adjusted to 2V and Ventricular amplitude reprogrammed to 2.5V per WC. Device programmed at appropriate safety margins. Histogram distribution appropriate for patient activity level. Device programmed to optimize intrinsic conduction. Estimated longevity 8-49yrs. ROV with WC in 71mo. Patient education completed.

## 2016-01-11 ENCOUNTER — Encounter: Payer: BLUE CROSS/BLUE SHIELD | Admitting: Cardiology

## 2016-01-22 ENCOUNTER — Other Ambulatory Visit: Payer: Self-pay

## 2016-04-09 ENCOUNTER — Ambulatory Visit (INDEPENDENT_AMBULATORY_CARE_PROVIDER_SITE_OTHER): Payer: Medicare Other | Admitting: Cardiology

## 2016-04-09 ENCOUNTER — Encounter: Payer: Self-pay | Admitting: Cardiology

## 2016-04-09 VITALS — BP 156/98 | HR 73 | Ht 65.0 in | Wt 229.2 lb

## 2016-04-09 DIAGNOSIS — I442 Atrioventricular block, complete: Secondary | ICD-10-CM

## 2016-04-09 LAB — CUP PACEART INCLINIC DEVICE CHECK
Battery Voltage: 3.02 V
Brady Statistic AP VP Percent: 34.75 %
Brady Statistic AP VS Percent: 0 %
Brady Statistic AS VP Percent: 65.25 %
Brady Statistic AS VS Percent: 0 %
Implantable Lead Implant Date: 20170517
Implantable Lead Implant Date: 20170517
Implantable Lead Model: 5076
Lead Channel Impedance Value: 361 Ohm
Lead Channel Impedance Value: 418 Ohm
Lead Channel Impedance Value: 475 Ohm
Lead Channel Pacing Threshold Amplitude: 0.5 V
Lead Channel Pacing Threshold Amplitude: 0.5 V
Lead Channel Pacing Threshold Pulse Width: 0.4 ms
Lead Channel Sensing Intrinsic Amplitude: 12.75 mV
Lead Channel Sensing Intrinsic Amplitude: 16.25 mV
Lead Channel Setting Pacing Amplitude: 1.5 V
MDC IDC LEAD LOCATION: 753859
MDC IDC LEAD LOCATION: 753860
MDC IDC MSMT BATTERY REMAINING LONGEVITY: 109 mo
MDC IDC MSMT LEADCHNL RA PACING THRESHOLD PULSEWIDTH: 0.4 ms
MDC IDC MSMT LEADCHNL RA SENSING INTR AMPL: 2 mV
MDC IDC MSMT LEADCHNL RA SENSING INTR AMPL: 2 mV
MDC IDC MSMT LEADCHNL RV IMPEDANCE VALUE: 551 Ohm
MDC IDC PG IMPLANT DT: 20170517
MDC IDC SESS DTM: 20171114162824
MDC IDC SET LEADCHNL RV PACING AMPLITUDE: 2.5 V
MDC IDC SET LEADCHNL RV PACING PULSEWIDTH: 0.4 ms
MDC IDC SET LEADCHNL RV SENSING SENSITIVITY: 2.8 mV
MDC IDC STAT BRADY RA PERCENT PACED: 34.72 %
MDC IDC STAT BRADY RV PERCENT PACED: 99.99 %

## 2016-04-09 NOTE — Progress Notes (Signed)
Electrophysiology Office Note   Date:  04/09/2016   ID:  Travis Mercado, DOB 16-Sep-1935, MRN [id number]  PCP:  Dwan Bolt, MD  Primary Electrophysiologist:  Constance Haw, MD    Chief Complaint  Patient presents with  . Pacemaker Check     History of Present Illness: Travis Mercado is a 80 y.o. male who presents today for electrophysiology evaluation.   History of HTN, HLD, CKD, DM and complete AV block.  Had pacemaker placed 10/11/15.    Today, he denies symptoms of palpitations, chest pain, shortness of breath, orthopnea, PND, lower extremity edema, claudication, dizziness, presyncope, syncope, bleeding, or neurologic sequela. The patient is tolerating medications without difficulties and is otherwise without complaint today.    Past Medical History:  Diagnosis Date  . DM2 (diabetes mellitus, type 2) ([place])   . GERD (gastroesophageal reflux disease)   . Heart murmur   . 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 HSV infection   . HTN (hypertension)   . Hyperlipidemia 06/05/2011  . Third degree heart block ([place]) 09/2015   Past Surgical History:  Procedure Laterality Date  . APPENDECTOMY    . EP IMPLANTABLE DEVICE N/A 10/11/2015   Procedure: Pacemaker Implant;  Surgeon: Demitrus Francisco Meredith Leeds, MD;  Location: [facility];  Service: Cardiovascular;  Laterality: N/A;     Current Outpatient Prescriptions  Medication Sig Dispense Refill  . amLODipine (NORVASC) 10 MG tablet Take 10 mg by mouth daily.    Marland Kitchen aspirin EC 81 MG EC tablet Take 1 tablet (81 mg total) by mouth daily.    . cholecalciferol (VITAMIN D) 1000 UNITS tablet Take 1,000 Units by mouth daily.    Marland Kitchen ezetimibe-simvastatin (VYTORIN) 10-40 MG per tablet Take 1 tablet by mouth at bedtime.    . hydrOXYzine (ATARAX/VISTARIL) 25 MG tablet Take 25 mg by mouth 3 (three) times daily as needed.    Marland Kitchen losartan (COZAAR) 100 MG tablet Take 100 mg by mouth daily.    .  pantoprazole (PROTONIX) 40 MG tablet Take 40 mg by mouth daily.     No current facility-administered medications for this visit.     Allergies:   Patient has no known allergies.   Social History:  The patient  reports that he quit smoking about 32 years ago. His smoking use included Cigarettes. He has never used smokeless tobacco. He reports that he does not drink alcohol or use drugs.   Family History:  The patient's family history includes Alzheimer's disease in his father; Cancer in his mother; Diabetes in his father and mother; Heart disease in his father; Hypertension in his brother; Prostate cancer in his brother and father.    ROS:  Please see the history of present illness.   Otherwise, review of systems is positive for none.   All other systems are reviewed and negative.    PHYSICAL EXAM: VS:  BP (!) 156/98   Pulse 73   Ht 5\' 5"  (1.651 m)   Wt 229 lb 3.2 oz (104 kg)   BMI 38.14 kg/m  , BMI Body mass index is 38.14 kg/m. GEN: Well nourished, well developed, in no acute distress  HEENT: normal  Neck: no JVD, carotid bruits, or masses Cardiac: RRR; no murmurs, rubs, or gallops,no edema  Respiratory:  clear to auscultation bilaterally, normal work of breathing GI: soft, nontender, nondistended, + BS MS: no deformity or atrophy  Skin: warm and dry,  device  pocket is well healed Neuro:  Strength and sensation are intact Psych: euthymic mood, full affect  EKG:  EKG is ordered today. Personal review of the ekg ordered shows sinus rhythm, V paced   Device interrogation is reviewed today in detail.  See PaceArt for details.   Recent Labs: 10/11/2015: ALT 14; BUN 22; Creatinine, Ser 1.63; Hemoglobin 12.7; Platelets 149; Potassium 3.9; Sodium 138; TSH 2.775    Lipid Panel     Component Value Date/Time   CHOL 132 10/11/2015 0525   TRIG 80 10/11/2015 0525   HDL 57 10/11/2015 0525   CHOLHDL 2.3 10/11/2015 0525   VLDL 16 10/11/2015 0525   LDLCALC 59 10/11/2015 0525      Wt Readings from Last 3 Encounters:  04/09/16 229 lb 3.2 oz (104 kg)  12/05/15 220 lb (99.8 kg)  11/01/15 214 lb (97.1 kg)      Other studies Reviewed: Additional studies/ records that were reviewed today include: TTE 10/11/15  Review of the above records today demonstrates:  - Left ventricle: The cavity size was normal. Wall thickness was   normal. Systolic function was normal. The estimated ejection   fraction was in the range of 60% to 65%. Wall motion was normal;   there were no regional wall motion abnormalities. Doppler   parameters are consistent with abnormal left ventricular   relaxation (grade 1 diastolic dysfunction). - Aortic valve: Trileaflet; moderately thickened, moderately   calcified leaflets. - Mitral valve: There was trivial regurgitation. - Tricuspid valve: There was mild regurgitation.   ASSESSMENT AND PLAN:  1.  Complete AV block: pacemaker placed 10/11/15. No changes made today. Stable parameters.  2. Hypertension: elevated today.  He says that his BP has been better in the past.  Would like to try changing his diet as he has been eating excessive salt.  He Adain Geurin call us back with BP readings if they remain greater than 140.  3. Hyperlipidemia: continue Vytorin  Current medicines are reviewed at length with the patient today.   The patient does not have concerns regarding his medicines.  The following changes were made today:  none  Labs/ tests ordered today include:  Orders Placed This Encounter  Procedures  . Implantable device check  . EKG 12-Lead     Disposition:   FU with Fread Kottke 9 months  Signed, Roxanne Panek Meredith Leeds, MD  04/09/2016 4:29 PM     [place] [address] [phone] (office) [phone] (fax)

## 2016-04-09 NOTE — Patient Instructions (Signed)
Medication Instructions: - Your physician recommends that you continue on your current medications as directed. Please refer to the Current Medication list given to you today.  Labwork: - none ordered  Procedures/Testing: - none ordered  Follow-Up: - Remote monitoring is used to monitor your Pacemaker of ICD from home. This monitoring reduces the number of office visits required to check your device to one time per year. It allows [place] to keep an eye on the functioning of your device to ensure it is working properly. You are scheduled for a device check from home on 07/09/16. You may send your transmission at any time that day. If you have a wireless device, the transmission will be sent automatically. After your physician reviews your transmission, you will receive a postcard with your next transmission date.  - Your physician wants you to follow-up in: 9 months with Dr. Curt Bears. You will receive a reminder letter in the mail two months in advance. If you don't receive a letter, please call our office to schedule the follow-up appointment.   Any Additional Special Instructions Will Be Listed Below (If Applicable).     If you need a refill on your cardiac medications before your next appointment, please call your pharmacy.

## 2016-07-09 ENCOUNTER — Ambulatory Visit (INDEPENDENT_AMBULATORY_CARE_PROVIDER_SITE_OTHER): Payer: Medicare Other | Admitting: *Deleted

## 2016-07-09 DIAGNOSIS — I442 Atrioventricular block, complete: Secondary | ICD-10-CM | POA: Diagnosis not present

## 2016-07-09 NOTE — Progress Notes (Signed)
Remote pacemaker transmission.   

## 2016-07-10 ENCOUNTER — Encounter: Payer: Self-pay | Admitting: Cardiology

## 2016-07-12 ENCOUNTER — Telehealth: Payer: Self-pay

## 2016-07-12 DIAGNOSIS — I4729 Other ventricular tachycardia: Secondary | ICD-10-CM

## 2016-07-12 DIAGNOSIS — I472 Ventricular tachycardia: Secondary | ICD-10-CM

## 2016-07-12 LAB — CUP PACEART REMOTE DEVICE CHECK
Battery Remaining Longevity: 103 mo
Brady Statistic AP VS Percent: 0 %
Brady Statistic AS VP Percent: 64.1 %
Brady Statistic AS VS Percent: 0 %
Date Time Interrogation Session: 20180213132455
Implantable Lead Implant Date: 20170517
Implantable Lead Location: 753859
Implantable Lead Model: 5076
Implantable Lead Model: 5076
Lead Channel Impedance Value: 380 Ohm
Lead Channel Pacing Threshold Amplitude: 0.5 V
Lead Channel Pacing Threshold Pulse Width: 0.4 ms
Lead Channel Sensing Intrinsic Amplitude: 1.75 mV
Lead Channel Sensing Intrinsic Amplitude: 1.75 mV
Lead Channel Sensing Intrinsic Amplitude: 11.625 mV
Lead Channel Sensing Intrinsic Amplitude: 11.625 mV
Lead Channel Setting Pacing Amplitude: 2.5 V
Lead Channel Setting Pacing Pulse Width: 0.4 ms
MDC IDC LEAD IMPLANT DT: 20170517
MDC IDC LEAD LOCATION: 753860
MDC IDC MSMT BATTERY VOLTAGE: 3.02 V
MDC IDC MSMT LEADCHNL RA IMPEDANCE VALUE: 418 Ohm
MDC IDC MSMT LEADCHNL RA PACING THRESHOLD AMPLITUDE: 0.625 V
MDC IDC MSMT LEADCHNL RA PACING THRESHOLD PULSEWIDTH: 0.4 ms
MDC IDC MSMT LEADCHNL RV IMPEDANCE VALUE: 494 Ohm
MDC IDC MSMT LEADCHNL RV IMPEDANCE VALUE: 570 Ohm
MDC IDC PG IMPLANT DT: 20170517
MDC IDC SET LEADCHNL RA PACING AMPLITUDE: 1.5 V
MDC IDC SET LEADCHNL RV SENSING SENSITIVITY: 2.8 mV
MDC IDC STAT BRADY AP VP PERCENT: 35.9 %
MDC IDC STAT BRADY RA PERCENT PACED: 35.88 %
MDC IDC STAT BRADY RV PERCENT PACED: 99.99 %

## 2016-07-12 MED ORDER — METOPROLOL TARTRATE 25 MG PO TABS
25.0000 mg | ORAL_TABLET | Freq: Two times a day (BID) | ORAL | 3 refills | Status: DC
Start: 2016-07-12 — End: 2018-11-13

## 2016-07-12 NOTE — Telephone Encounter (Signed)
Pt agreeable to start lopressor 25mg  BID per Dr. Curt Bears.

## 2016-07-24 ENCOUNTER — Encounter: Payer: Self-pay | Admitting: Cardiology

## 2016-08-17 ENCOUNTER — Encounter (HOSPITAL_BASED_OUTPATIENT_CLINIC_OR_DEPARTMENT_OTHER): Payer: Self-pay | Admitting: Emergency Medicine

## 2016-08-17 ENCOUNTER — Emergency Department (HOSPITAL_BASED_OUTPATIENT_CLINIC_OR_DEPARTMENT_OTHER)
Admission: EM | Admit: 2016-08-17 | Discharge: 2016-08-17 | Disposition: A | Discharge status: Home / Self Care | Payer: Medicare Other | Attending: Emergency Medicine | Admitting: Emergency Medicine

## 2016-08-17 DIAGNOSIS — Z7982 Long term (current) use of aspirin: Secondary | ICD-10-CM | POA: Insufficient documentation

## 2016-08-17 DIAGNOSIS — I10 Essential (primary) hypertension: Secondary | ICD-10-CM | POA: Insufficient documentation

## 2016-08-17 DIAGNOSIS — E119 Type 2 diabetes mellitus without complications: Secondary | ICD-10-CM | POA: Insufficient documentation

## 2016-08-17 DIAGNOSIS — R220 Localized swelling, mass and lump, head: Secondary | ICD-10-CM | POA: Diagnosis present

## 2016-08-17 DIAGNOSIS — Z87891 Personal history of nicotine dependence: Secondary | ICD-10-CM | POA: Insufficient documentation

## 2016-08-17 DIAGNOSIS — Z79899 Other long term (current) drug therapy: Secondary | ICD-10-CM | POA: Diagnosis not present

## 2016-08-17 NOTE — Discharge Instructions (Signed)
Take your atarax regularly.  Return for worsening swelling, shortness of breath vomiting, diarrhea.  Discuss with your family doc about maybe switching your cozaar to something else.

## 2016-08-17 NOTE — ED Provider Notes (Signed)
[facility] Provider Note   CSN: [id number] Arrival date & time: 08/17/16  1003     History   Chief Complaint Chief Complaint  Patient presents with  . Facial Swelling    HPI Travis Mercado is a 81 y.o. male.  81 yo M with a chief complaint of facial swelling. Patient states he woke up with this morning. He had some shrimp last night he thinks might be the cause. He denies wheezing denies vomiting shortness of breath tongue or throat swelling. Denies diarrhea denies feeling like he'll pass out. Denies recent medication changes.   The history is provided by the patient.  Illness  This is a new problem. The current episode started 3 to 5 hours ago. The problem occurs constantly. The problem has not changed since onset.Pertinent negatives include no chest pain, no abdominal pain, no headaches and no shortness of breath. Nothing aggravates the symptoms. Nothing relieves the symptoms. He has tried nothing for the symptoms. The treatment provided no relief.    Past Medical History:  Diagnosis Date  . DM2 (diabetes mellitus, type 2) ([place])   . GERD (gastroesophageal reflux disease)   . Heart murmur   . 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 HSV infection   . HTN (hypertension)   . Hyperlipidemia 06/05/2011  . Third degree heart block ([place]) 09/2015    Patient Active Problem List   Diagnosis Date Noted  . Facial droop 10/11/2015  . Symptomatic bradycardia 10/11/2015  . Mobitz type 2 second degree AV block 10/11/2015  . Heart block 10/11/2015  . AV block, 3rd degree ([facility])   . Cardiac device in situ, other   . TIA (transient ischemic attack)   . HLD (hyperlipidemia)   . Type 2 diabetes mellitus with circulatory disorder ([place])   . Intracranial vascular stenosis   . Fusiform Aneurysm, ascending aorta 06/05/2011  . Hyperlipidemia 06/05/2011  . DM2 (diabetes mellitus, type 2) ([place])   . HTN (hypertension)   . HSV 03/25/2007    Past  Surgical History:  Procedure Laterality Date  . APPENDECTOMY    . EP IMPLANTABLE DEVICE N/A 10/11/2015   Procedure: Pacemaker Implant;  Surgeon: Will Meredith Leeds, MD;  Location: [facility];  Service: Cardiovascular;  Laterality: N/A;       Home Medications    Prior to Admission medications   Medication Sig Start Date End Date Taking? Authorizing Provider  amLODipine (NORVASC) 10 MG tablet Take 10 mg by mouth daily.   Yes Historical Provider, MD  cholecalciferol (VITAMIN D) 1000 UNITS tablet Take 1,000 Units by mouth daily.   Yes Historical Provider, MD  ezetimibe-simvastatin (VYTORIN) 10-40 MG per tablet Take 1 tablet by mouth at bedtime.   Yes Historical Provider, MD  hydrOXYzine (ATARAX/VISTARIL) 25 MG tablet Take 25 mg by mouth 3 (three) times daily as needed.   Yes Historical Provider, MD  losartan (COZAAR) 100 MG tablet Take 100 mg by mouth daily.   Yes Historical Provider, MD  metoprolol tartrate (LOPRESSOR) 25 MG tablet Take 1 tablet (25 mg total) by mouth 2 (two) times daily. 07/12/16 10/10/16 Yes Will Meredith Leeds, MD  pantoprazole (PROTONIX) 40 MG tablet Take 40 mg by mouth daily.   Yes Historical Provider, MD  aspirin EC 81 MG EC tablet Take 1 tablet (81 mg total) by mouth daily. 10/12/15   Amber Sena Slate, NP    Family History Family History  Problem Relation Age of  Onset  . Diabetes Mother   . Cancer Mother   . Diabetes Father   . Prostate cancer Father   . Heart disease Father   . Alzheimer's disease Father   . Prostate cancer Brother   . Hypertension Brother     Social History Social History  Substance Use Topics  . Smoking status: Former Smoker    Types: Cigarettes    Quit date: 05/28/1983  . Smokeless tobacco: Never Used  . Alcohol use No     Allergies   Patient has no known allergies.   Review of Systems Review of Systems  Constitutional: Negative for chills and fever.  HENT: Positive for facial swelling. Negative for congestion.   Eyes:  Negative for discharge and visual disturbance.  Respiratory: Negative for shortness of breath.   Cardiovascular: Negative for chest pain and palpitations.  Gastrointestinal: Negative for abdominal pain, diarrhea and vomiting.  Musculoskeletal: Negative for arthralgias and myalgias.  Skin: Negative for color change and rash.  Neurological: Negative for tremors, syncope and headaches.  Psychiatric/Behavioral: Negative for confusion and dysphoric mood.     Physical Exam Updated Vital Signs BP 116/79 (BP Location: Right Arm)   Pulse 78   Temp 97.8 F (36.6 C) (Oral)   Resp 16   Ht 5\' 9"  (1.753 m)   Wt 216 lb (98 kg)   SpO2 98%   BMI 31.90 kg/m   Physical Exam  Constitutional: He is oriented to person, place, and time. He appears well-developed and well-nourished.  HENT:  Head: Normocephalic and atraumatic.  No appreciable swelling of the face, mouth or throat  Eyes: EOM are normal. Pupils are equal, round, and reactive to light.  Neck: Normal range of motion. Neck supple. No JVD present.  Cardiovascular: Normal rate and regular rhythm.  Exam reveals no gallop and no friction rub.   No murmur heard. Pulmonary/Chest: No respiratory distress. He has no wheezes.  Abdominal: He exhibits no distension and no mass. There is no tenderness. There is no rebound and no guarding.  Musculoskeletal: Normal range of motion.  Neurological: He is alert and oriented to person, place, and time.  Skin: No rash noted. No pallor.  Psychiatric: He has a normal mood and affect. His behavior is normal.  Nursing note and vitals reviewed.    ED Treatments / Results  Labs (all labs ordered are listed, but only abnormal results are displayed) Labs Reviewed - No data to display  EKG  EKG Interpretation None       Radiology No results found.  Procedures Procedures (including critical care time)  Medications Ordered in ED Medications - No data to display   Initial Impression / Assessment  and Plan / ED Course  I have reviewed the triage vital signs and the nursing notes.  Pertinent labs & imaging results that were available during my care of the patient were reviewed by me and considered in my medical decision making (see chart for details).     81 yo M with a chief complaint of facial swelling. So little I am unable to visualize it on exam. No warmth or rash. Doubt cellulitis. Question angioedema secondary to Cozaar. There is no intraoral swelling. Patient does have a history of diabetes will not start on steroids at this time. PCP follow-up.  Final Clinical Impressions(s) / ED Diagnoses   Final diagnoses:  Facial swelling    New Prescriptions New Prescriptions   No medications on file     Deno Etienne, DO 08/17/16  1041  

## 2016-08-17 NOTE — ED Triage Notes (Signed)
Woke up with mid facial swelling, ? Cause. Denies throat swelling or SOB. Slight swelling and redness noted face, denies itching or having a prior episode , ate seafood last night, but denies prior seafood allergy

## 2016-10-08 ENCOUNTER — Telehealth: Payer: Self-pay | Admitting: Cardiology

## 2016-10-08 ENCOUNTER — Ambulatory Visit (INDEPENDENT_AMBULATORY_CARE_PROVIDER_SITE_OTHER): Payer: Medicare Other | Admitting: *Deleted

## 2016-10-08 DIAGNOSIS — I442 Atrioventricular block, complete: Secondary | ICD-10-CM | POA: Diagnosis not present

## 2016-10-08 NOTE — Progress Notes (Signed)
Remote pacemaker transmission.   

## 2016-10-08 NOTE — Telephone Encounter (Signed)
LMOVM reminding pt to send remote transmission.   

## 2016-10-09 ENCOUNTER — Encounter: Payer: Self-pay | Admitting: Cardiology

## 2016-10-25 ENCOUNTER — Encounter: Payer: Self-pay | Admitting: Cardiology

## 2017-01-02 ENCOUNTER — Ambulatory Visit (INDEPENDENT_AMBULATORY_CARE_PROVIDER_SITE_OTHER): Payer: Medicare Other | Admitting: Cardiology

## 2017-01-02 ENCOUNTER — Encounter: Payer: Self-pay | Admitting: Cardiology

## 2017-01-02 VITALS — BP 122/80 | HR 82 | Ht 66.0 in | Wt 222.0 lb

## 2017-01-02 DIAGNOSIS — I10 Essential (primary) hypertension: Secondary | ICD-10-CM | POA: Diagnosis not present

## 2017-01-02 DIAGNOSIS — E782 Mixed hyperlipidemia: Secondary | ICD-10-CM | POA: Diagnosis not present

## 2017-01-02 DIAGNOSIS — I442 Atrioventricular block, complete: Secondary | ICD-10-CM

## 2017-01-02 LAB — CUP PACEART INCLINIC DEVICE CHECK
Brady Statistic AP VS Percent: 0 %
Brady Statistic AS VP Percent: 45.67 %
Brady Statistic AS VS Percent: 0 %
Implantable Lead Implant Date: 20170517
Lead Channel Impedance Value: 418 Ohm
Lead Channel Impedance Value: 475 Ohm
Lead Channel Impedance Value: 570 Ohm
Lead Channel Pacing Threshold Amplitude: 0.5 V
Lead Channel Pacing Threshold Amplitude: 0.75 V
Lead Channel Pacing Threshold Pulse Width: 0.4 ms
Lead Channel Pacing Threshold Pulse Width: 0.4 ms
Lead Channel Setting Pacing Amplitude: 1.5 V
Lead Channel Setting Sensing Sensitivity: 2.8 mV
MDC IDC LEAD IMPLANT DT: 20170517
MDC IDC LEAD LOCATION: 753859
MDC IDC LEAD LOCATION: 753860
MDC IDC MSMT BATTERY REMAINING LONGEVITY: 95 mo
MDC IDC MSMT BATTERY VOLTAGE: 3.01 V
MDC IDC MSMT LEADCHNL RA IMPEDANCE VALUE: 342 Ohm
MDC IDC MSMT LEADCHNL RA SENSING INTR AMPL: 1.75 mV
MDC IDC MSMT LEADCHNL RA SENSING INTR AMPL: 1.875 mV
MDC IDC MSMT LEADCHNL RV SENSING INTR AMPL: 18.375 mV
MDC IDC MSMT LEADCHNL RV SENSING INTR AMPL: 18.375 mV
MDC IDC PG IMPLANT DT: 20170517
MDC IDC SESS DTM: 20180809100508
MDC IDC SET LEADCHNL RV PACING AMPLITUDE: 2.5 V
MDC IDC SET LEADCHNL RV PACING PULSEWIDTH: 0.4 ms
MDC IDC STAT BRADY AP VP PERCENT: 54.33 %
MDC IDC STAT BRADY RA PERCENT PACED: 54.3 %
MDC IDC STAT BRADY RV PERCENT PACED: 99.99 %

## 2017-01-02 NOTE — Patient Instructions (Signed)
Your physician wants you to follow-up in: [place] will receive a reminder letter in the mail two months in advance. If you don't receive a letter, please call our office to schedule the follow-up appointment.   Remote monitoring is used to monitor your Pacemaker of ICD from home. This monitoring reduces the number of office visits required to check your device to one time per year. It allows [place] to keep an eye on the functioning of your device to ensure it is working properly. You are scheduled for a device check from home on 04/03/2017. You may send your transmission at any time that day. If you have a wireless device, the transmission will be sent automatically. After your physician reviews your transmission, you will receive a postcard with your next transmission date.   If you need a refill on your cardiac medications before your next appointment, please call your pharmacy.

## 2017-01-02 NOTE — Progress Notes (Signed)
Electrophysiology Office Note   Date:  01/02/2017   ID:  Travis Mercado, DOB 06-01-1935, MRN [id number]  PCP:  Patient, No Pcp Per  Primary Electrophysiologist:  Korban Shearer Meredith Leeds, MD    Chief Complaint  Patient presents with  . Pacemaker Check    complete heart block     History of Present Illness: Travis Mercado is a 81 y.o. male who presents today for electrophysiology evaluation.   History of HTN, HLD, CKD, DM and complete AV block.  Had pacemaker placed 10/11/15.    Today, denies symptoms of palpitations, chest pain, shortness of breath, orthopnea, PND, lower extremity edema, claudication, presyncope, syncope, bleeding, or neurologic sequela. The patient is tolerating medications without difficulties and is otherwise without complaint today.  He gets occasional dizziness when he stands up. Otherwise he is feeling well without major complaint.   Past Medical History:  Diagnosis Date  . DM2 (diabetes mellitus, type 2) ([place])   . GERD (gastroesophageal reflux disease)   . Heart murmur   . 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 HSV infection   . HTN (hypertension)   . Hyperlipidemia 06/05/2011  . Third degree heart block ([facility]) 09/2015   Past Surgical History:  Procedure Laterality Date  . APPENDECTOMY    . EP IMPLANTABLE DEVICE N/A 10/11/2015   Procedure: Pacemaker Implant;  Surgeon: Sobia Karger Meredith Leeds, MD;  Location: [facility];  Service: Cardiovascular;  Laterality: N/A;     Current Outpatient Prescriptions  Medication Sig Dispense Refill  . amLODipine (NORVASC) 10 MG tablet Take 10 mg by mouth daily.    Marland Kitchen aspirin EC 81 MG EC tablet Take 1 tablet (81 mg total) by mouth daily.    . cholecalciferol (VITAMIN D) 1000 UNITS tablet Take 1,000 Units by mouth daily.    Marland Kitchen ezetimibe-simvastatin (VYTORIN) 10-40 MG per tablet Take 1 tablet by mouth at bedtime.    . hydrOXYzine (ATARAX/VISTARIL) 25 MG tablet Take 25 mg by mouth 3 (three)  times daily as needed.    Marland Kitchen losartan (COZAAR) 100 MG tablet Take 100 mg by mouth daily.    . metoprolol tartrate (LOPRESSOR) 25 MG tablet Take 1 tablet (25 mg total) by mouth 2 (two) times daily. 180 tablet 3  . pantoprazole (PROTONIX) 40 MG tablet Take 40 mg by mouth daily.     No current facility-administered medications for this visit.     Allergies:   Patient has no known allergies.   Social History:  The patient  reports that he quit smoking about 33 years ago. His smoking use included Cigarettes. He has never used smokeless tobacco. He reports that he does not drink alcohol or use drugs.   Family History:  The patient's family history includes Alzheimer's disease in his father; Cancer in his mother; Diabetes in his father and mother; Heart disease in his father; Hypertension in his brother; Prostate cancer in his brother and father.    ROS:  Please see the history of present illness.   Otherwise, review of systems is positive for Appetite change, rash, dizziness.   All other systems are reviewed and negative.   PHYSICAL EXAM: VS:  BP 122/80   Pulse 82   Ht 5\' 6"  (1.676 m)   Wt 222 lb (100.7 kg)   BMI 35.83 kg/m  , BMI Body mass index is 35.83 kg/m. GEN: Well nourished, well developed, in no acute distress  HEENT: normal  Neck:  no JVD, carotid bruits, or masses Cardiac: RRR; no murmurs, rubs, or gallops,no edema  Respiratory:  clear to auscultation bilaterally, normal work of breathing GI: soft, nontender, nondistended, + BS MS: no deformity or atrophy  Skin: warm and dry, device site well healed Neuro:  Strength and sensation are intact Psych: euthymic mood, full affect  EKG:  EKG is ordered today. Personal review of the ekg ordered shows AV paced  Personal review of the device interrogation today. Results in [place]: No results found for requested labs within last 8760 hours.    Lipid Panel     Component Value Date/Time   CHOL 132 10/11/2015  0525   TRIG 80 10/11/2015 0525   HDL 57 10/11/2015 0525   CHOLHDL 2.3 10/11/2015 0525   VLDL 16 10/11/2015 0525   LDLCALC 59 10/11/2015 0525     Wt Readings from Last 3 Encounters:  01/02/17 222 lb (100.7 kg)  08/17/16 216 lb (98 kg)  04/09/16 229 lb 3.2 oz (104 kg)      Other studies Reviewed: Additional studies/ records that were reviewed today include: TTE 10/11/15  Review of the above records today demonstrates:  - Left ventricle: The cavity size was normal. Wall thickness was   normal. Systolic function was normal. The estimated ejection   fraction was in the range of 60% to 65%. Wall motion was normal;   there were no regional wall motion abnormalities. Doppler   parameters are consistent with abnormal left ventricular   relaxation (grade 1 diastolic dysfunction). - Aortic valve: Trileaflet; moderately thickened, moderately   calcified leaflets. - Mitral valve: There was trivial regurgitation. - Tricuspid valve: There was mild regurgitation.   ASSESSMENT AND PLAN:  1.  Complete AV block: Pacemaker implanted 10/11/15. Leads are stable. He did have 2 runs of ventricular tachycardia. Both nonsustained long as 12 beats. Continue to monitor.  2. Hypertension: Blood pressure well controlled today. No changes at this time.  3. Hyperlipidemia: Continue Vytorin.  Current medicines are reviewed at length with the patient today.   The patient does not have concerns regarding his medicines.  The following changes were made today:    Labs/ tests ordered today include:  Orders Placed This Encounter  Procedures  . EKG 12-Lead     Disposition:   FU with Raheel Kunkle 12 months  Signed, Neyah Ellerman Meredith Leeds, MD  01/02/2017 10:04 AM     [facility] [address] [place] [address] [phone] (office) [phone] (fax)

## 2017-01-24 ENCOUNTER — Encounter (HOSPITAL_BASED_OUTPATIENT_CLINIC_OR_DEPARTMENT_OTHER): Payer: Self-pay | Admitting: *Deleted

## 2017-01-24 ENCOUNTER — Emergency Department (HOSPITAL_BASED_OUTPATIENT_CLINIC_OR_DEPARTMENT_OTHER)
Admission: EM | Admit: 2017-01-24 | Discharge: 2017-01-24 | Disposition: A | Discharge status: Home / Self Care | Payer: Medicare Other | Attending: Emergency Medicine | Admitting: Emergency Medicine

## 2017-01-24 DIAGNOSIS — Z7982 Long term (current) use of aspirin: Secondary | ICD-10-CM | POA: Insufficient documentation

## 2017-01-24 DIAGNOSIS — Z950 Presence of cardiac pacemaker: Secondary | ICD-10-CM | POA: Diagnosis not present

## 2017-01-24 DIAGNOSIS — E119 Type 2 diabetes mellitus without complications: Secondary | ICD-10-CM | POA: Diagnosis not present

## 2017-01-24 DIAGNOSIS — Z87891 Personal history of nicotine dependence: Secondary | ICD-10-CM | POA: Diagnosis not present

## 2017-01-24 DIAGNOSIS — Z79899 Other long term (current) drug therapy: Secondary | ICD-10-CM | POA: Insufficient documentation

## 2017-01-24 DIAGNOSIS — R42 Dizziness and giddiness: Secondary | ICD-10-CM | POA: Insufficient documentation

## 2017-01-24 DIAGNOSIS — I10 Essential (primary) hypertension: Secondary | ICD-10-CM | POA: Insufficient documentation

## 2017-01-24 LAB — URINALYSIS, ROUTINE W REFLEX MICROSCOPIC
Bilirubin Urine: NEGATIVE
Glucose, UA: NEGATIVE mg/dL
Ketones, ur: NEGATIVE mg/dL
Leukocytes, UA: NEGATIVE
Nitrite: NEGATIVE
Protein, ur: NEGATIVE mg/dL
Specific Gravity, Urine: 1.02 (ref 1.005–1.030)
pH: 6 (ref 5.0–8.0)

## 2017-01-24 LAB — CBC
HCT: 37.4 % — ABNORMAL LOW (ref 39.0–52.0)
Hemoglobin: 13.5 g/dL (ref 13.0–17.0)
MCH: 25.6 pg — ABNORMAL LOW (ref 26.0–34.0)
MCHC: 36.1 g/dL — ABNORMAL HIGH (ref 30.0–36.0)
MCV: 71 fL — ABNORMAL LOW (ref 78.0–100.0)
Platelets: 161 10*3/uL (ref 150–400)
RBC: 5.27 MIL/uL (ref 4.22–5.81)
RDW: 14.9 % (ref 11.5–15.5)
WBC: 6.3 10*3/uL (ref 4.0–10.5)

## 2017-01-24 LAB — BASIC METABOLIC PANEL WITH GFR
Anion gap: 9 (ref 5–15)
BUN: 16 mg/dL (ref 6–20)
CO2: 24 mmol/L (ref 22–32)
Calcium: 8.9 mg/dL (ref 8.9–10.3)
Chloride: 102 mmol/L (ref 101–111)
Creatinine, Ser: 1.67 mg/dL — ABNORMAL HIGH (ref 0.61–1.24)
GFR calc Af Amer: 44 mL/min — ABNORMAL LOW
GFR calc non Af Amer: 38 mL/min — ABNORMAL LOW
Glucose, Bld: 197 mg/dL — ABNORMAL HIGH (ref 65–99)
Potassium: 3.8 mmol/L (ref 3.5–5.1)
Sodium: 135 mmol/L (ref 135–145)

## 2017-01-24 LAB — URINALYSIS, MICROSCOPIC (REFLEX)

## 2017-01-24 LAB — CBG MONITORING, ED: Glucose-Capillary: 177 mg/dL — ABNORMAL HIGH (ref 65–99)

## 2017-01-24 MED ORDER — MECLIZINE HCL 12.5 MG PO TABS: 12.5000 mg | ORAL_TABLET | Freq: Three times a day (TID) | ORAL | 0 refills | Status: DC | PRN

## 2017-01-24 MED ORDER — PROMETHAZINE HCL 25 MG PO TABS: 12.5000 mg | ORAL_TABLET | Freq: Once | ORAL | Status: DC

## 2017-01-24 MED ORDER — MECLIZINE HCL 25 MG PO TABS
25.0000 mg | ORAL_TABLET | Freq: Once | ORAL | Status: AC
  Administered 2017-01-24: 25 mg via ORAL
  Filled 2017-01-24: qty 1

## 2017-01-24 MED ORDER — MECLIZINE HCL 25 MG PO TABS: 25.0000 mg | ORAL_TABLET | Freq: Three times a day (TID) | ORAL | 0 refills | Status: DC | PRN

## 2017-01-24 MED FILL — MECLIZINE 25 MG TABLET: 25 | 7 days supply | Qty: 21 | Fill #0

## 2017-01-24 NOTE — ED Triage Notes (Signed)
Pt c/o dizziness x 1 day. Denies Cp or SOB

## 2017-01-24 NOTE — ED Provider Notes (Signed)
[facility] Provider Note   CSN: [id number] Arrival date & time: 01/24/17  1314     History   Chief Complaint Chief Complaint  Patient presents with  . Dizziness    HPI Travis Mercado is a 81 y.o. male.  HPI   80yM with dizziness. Describes sensation of things "swaying." Onset yesterday. First noticed when getting out of bed. Has been intermittent since then. Feels better at rest. No acute pain. No tenderness. No shortness of breath.  Past Medical History:  Diagnosis Date  . DM2 (diabetes mellitus, type 2) ([place])   . GERD (gastroesophageal reflux disease)   . Heart murmur   . 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 HSV infection   . HTN (hypertension)   . Hyperlipidemia 06/05/2011  . Third degree heart block ([place]) 09/2015    Patient Active Problem List   Diagnosis Date Noted  . Facial droop 10/11/2015  . Symptomatic bradycardia 10/11/2015  . Mobitz type 2 second degree AV block 10/11/2015  . Heart block 10/11/2015  . AV block, 3rd degree (HCC)   . Cardiac device in situ, other   . TIA (transient ischemic attack)   . HLD (hyperlipidemia)   . Type 2 diabetes mellitus with circulatory disorder ([place])   . Intracranial vascular stenosis   . Fusiform Aneurysm, ascending aorta 06/05/2011  . Hyperlipidemia 06/05/2011  . DM2 (diabetes mellitus, type 2) ([place])   . HTN (hypertension)   . HSV 03/25/2007    Past Surgical History:  Procedure Laterality Date  . APPENDECTOMY    . EP IMPLANTABLE DEVICE N/A 10/11/2015   Procedure: Pacemaker Implant;  Surgeon: Will Meredith Leeds, MD;  Location: [facility];  Service: Cardiovascular;  Laterality: N/A;       Home Medications    Prior to Admission medications   Medication Sig Start Date End Date Taking? Authorizing Provider  amLODipine (NORVASC) 10 MG tablet Take 10 mg by mouth daily.    [provider]  aspirin EC 81 MG EC tablet Take 1 tablet (81 mg total) by  mouth daily. 10/12/15   Chanetta Marshall K, NP  cholecalciferol (VITAMIN D) 1000 UNITS tablet Take 1,000 Units by mouth daily.    [provider]  ezetimibe-simvastatin (VYTORIN) 10-40 MG per tablet Take 1 tablet by mouth at bedtime.    [provider]  hydrOXYzine (ATARAX/VISTARIL) 25 MG tablet Take 25 mg by mouth 3 (three) times daily as needed.    [provider]  losartan (COZAAR) 100 MG tablet Take 100 mg by mouth daily.    [provider]  metoprolol tartrate (LOPRESSOR) 25 MG tablet Take 1 tablet (25 mg total) by mouth 2 (two) times daily. 07/12/16 01/02/17  Camnitz, Will Hassell Done, MD  pantoprazole (PROTONIX) 40 MG tablet Take 40 mg by mouth daily.    [provider]    Family History Family History  Problem Relation Age of Onset  . Diabetes Mother   . Cancer Mother   . Diabetes Father   . Prostate cancer Father   . Heart disease Father   . Alzheimer's disease Father   . Prostate cancer Brother   . Hypertension Brother     Social History Social History  Substance Use Topics  . Smoking status: Former Smoker    Types: Cigarettes    Quit date: 05/28/1983  . Smokeless tobacco: Never Used  . Alcohol use No     Allergies  Patient has no known allergies.   Review of Systems Review of Systems  All systems reviewed and negative, other than as noted in HPI.   Physical Exam Updated Vital Signs BP (!) 145/86   Pulse 82   Temp 98.7 F (37.1 C)   Resp 18   Ht 5\' 6"  (1.676 m)   Wt 96.2 kg (212 lb)   SpO2 100%   BMI 34.22 kg/m   Physical Exam  Constitutional: He appears well-developed and well-nourished. No distress.  HENT:  Head: Normocephalic and atraumatic.  Eyes: Conjunctivae are normal. Right eye exhibits no discharge. Left eye exhibits no discharge.  Neck: Neck supple.  Cardiovascular: Normal rate, regular rhythm and normal heart sounds.  Exam reveals no gallop and no friction rub.   No murmur heard. Pulmonary/Chest:  Effort normal and breath sounds normal. No respiratory distress.  Abdominal: Soft. He exhibits no distension. There is no tenderness.  Musculoskeletal: He exhibits no edema or tenderness.  Neurological: He is alert.  Skin: Skin is warm and dry.  Psychiatric: He has a normal mood and affect. His behavior is normal. Thought content normal.  Nursing note and vitals reviewed.    ED Treatments / Results  Labs (all labs ordered are listed, but only abnormal results are displayed) Labs Reviewed  BASIC METABOLIC PANEL - Abnormal; Notable for the following:       Result Value   Glucose, Bld 197 (*)    Creatinine, Ser 1.67 (*)    GFR calc non Af Amer 38 (*)    GFR calc Af Amer 44 (*)    All other components within normal limits  CBC - Abnormal; Notable for the following:    HCT 37.4 (*)    MCV 71.0 (*)    MCH 25.6 (*)    MCHC 36.1 (*)    All other components within normal limits  URINALYSIS, ROUTINE W REFLEX MICROSCOPIC - Abnormal; Notable for the following:    Hgb urine dipstick TRACE (*)    All other components within normal limits  URINALYSIS, MICROSCOPIC (REFLEX) - Abnormal; Notable for the following:    Bacteria, UA RARE (*)    Squamous Epithelial / LPF 0-5 (*)    All other components within normal limits  CBG MONITORING, ED - Abnormal; Notable for the following:    Glucose-Capillary 177 (*)    All other components within normal limits    EKG  EKG Interpretation  Date/Time:  Friday January 24 2017 13:38:04 EDT Ventricular Rate:  62 PR Interval:    QRS Duration: 144 QT Interval:  472 QTC Calculation: 480 R Axis:   -89 Text Interpretation:  Atrial-ventricular dual-paced rhythm No further analysis attempted due to paced rhythm No significant change was found Confirmed by Jola Schmidt [id number]) on 01/25/2017 12:46:46 PM       Radiology No results found.  Procedures Procedures (including critical care time)  Medications Ordered in ED Medications - No data to  display   Initial Impression / Assessment and Plan / ED Course  I have reviewed the triage vital signs and the nursing notes.  Pertinent labs & imaging results that were available during my care of the patient were reviewed by me and considered in my medical decision making (see chart for details).     81 year old male with dizziness. My impression that this is likely peripheral vertigo. Nonfocal neuro exam. HEENT exam is stable. EKG is paced. No exertional symptoms. It has been determined that no acute conditions requiring further  emergency intervention are present at this time. The patient has been advised of the diagnosis and plan. I reviewed any labs and imaging including any potential incidental findings. We have discussed signs and symptoms that warrant return to the ED and they are listed in the discharge instructions. '  Final Clinical Impressions(s) / ED Diagnoses   Final diagnoses:  Vertigo    New Prescriptions New Prescriptions   No medications on file     Virgel Manifold, MD 02/03/17 [phone]

## 2017-03-24 ENCOUNTER — Ambulatory Visit (INDEPENDENT_AMBULATORY_CARE_PROVIDER_SITE_OTHER): Payer: Medicare Other | Admitting: Neurology

## 2017-03-24 ENCOUNTER — Encounter: Payer: Self-pay | Admitting: Neurology

## 2017-03-24 VITALS — BP 103/73 | HR 88 | Ht 66.0 in | Wt 223.0 lb

## 2017-03-24 DIAGNOSIS — R251 Tremor, unspecified: Secondary | ICD-10-CM | POA: Diagnosis not present

## 2017-03-24 NOTE — Progress Notes (Signed)
Subjective:    Patient ID: Travis Mercado is a 81 y.o. male.  HPI     Star Age, MD, PhD [facility] [address], [address] [place], [place] [address]  Dear Dr. Shelia Media,   I saw your patient, Travis Mercado, upon your kind request, in my neurologic clinic today for initial consultation of his tremors. The patient is accompanied today. As you know, Travis Mercado is a 81 year old right-handed gentleman with an underlying complex medical history of hyperlipidemia, hypertension, TIA, type 2 diabetes, aortic aneurysm with status post repair, reflux disease, third-degree heart block with status post pacemaker placement, vitamin D deficiency, and obesity, who reports a tremor particularly affecting his right hand for years. I reviewed your office note from 03/18/2017, which you kindly included. He feels the worst when he is trying to write, which has become worse over time. Father and paternal had tremor, father with hand tremors and aunt had head and hand tremors. He had not been on Mysoline. He does not drink alcohol. He uses splenda in his tea. Overall, he is not particularly bothered by the tremor on a day-to-day basis with the exception that he is not able to write typically. He recalls that his father taught himself to write with the left hand eventually as his tremor became worse.  Patient is retired from [employer] work. He was a heavy Company secretary for 31 years and never had a problem with tremor while performing his job. He has 4 children, none of which have issues with tremors. He lives with his wife.   His Past Medical History Is Significant For: Past Medical History:  Diagnosis Date  . DM2 (diabetes mellitus, type 2) ([place])   . GERD (gastroesophageal reflux disease)   . Heart murmur   . 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 HSV infection   . HTN (hypertension)   . Hyperlipidemia 06/05/2011  . Third degree heart block  ([place]) 09/2015    His Past Surgical History Is Significant For: Past Surgical History:  Procedure Laterality Date  . APPENDECTOMY    . EP IMPLANTABLE DEVICE N/A 10/11/2015   Procedure: Pacemaker Implant;  Surgeon: Will Meredith Leeds, MD;  Location: [facility];  Service: Cardiovascular;  Laterality: N/A;    His Family History Is Significant For: Family History  Problem Relation Age of Onset  . Diabetes Mother   . Cancer Mother   . Diabetes Father   . Prostate cancer Father   . Heart disease Father   . Alzheimer's disease Father   . Prostate cancer Brother   . Hypertension Brother     His Social History Is Significant For: Social History   Social History  . Marital status: Married    Spouse name: N/A  . Number of children: N/A  . Years of education: N/A   Social History Main Topics  . Smoking status: Former Smoker    Types: Cigarettes    Quit date: 05/28/1983  . Smokeless tobacco: Never Used  . Alcohol use No  . Drug use: No  . Sexual activity: Not Asked   Other Topics Concern  . None   Social History Narrative  . None   His Allergies Are:  No Known Allergies:   His Current Medications Are:  Outpatient Encounter Prescriptions as of 03/24/2017  Medication Sig  . amLODipine (NORVASC) 10 MG tablet Take 10 mg by mouth daily.  Marland Kitchen aspirin EC 81 MG  EC tablet Take 1 tablet (81 mg total) by mouth daily.  . cholecalciferol (VITAMIN D) 1000 UNITS tablet Take 1,000 Units by mouth daily.  Marland Kitchen ezetimibe-simvastatin (VYTORIN) 10-40 MG per tablet Take 1 tablet by mouth at bedtime.  . hydrOXYzine (ATARAX/VISTARIL) 25 MG tablet Take 25 mg by mouth 3 (three) times daily as needed.  Marland Kitchen losartan (COZAAR) 100 MG tablet Take 100 mg by mouth daily.  . meclizine (ANTIVERT) 12.5 MG tablet Take 1 tablet (12.5 mg total) by mouth 3 (three) times daily as needed for dizziness.  . meclizine (ANTIVERT) 25 MG tablet Take 1 tablet (25 mg total) by mouth 3 (three) times daily as needed for  dizziness.  . pantoprazole (PROTONIX) 40 MG tablet Take 40 mg by mouth daily.  . metoprolol tartrate (LOPRESSOR) 25 MG tablet Take 1 tablet (25 mg total) by mouth 2 (two) times daily.   No facility-administered encounter medications on file as of 03/24/2017.   : Review of Systems:  Out of a complete 14 point review of systems, all are reviewed and negative with the exception of these symptoms as listed below: Review of Systems  Neurological:       Pt presents today to discuss his right hand tremor. The tremor has been present for years. Pt notices the tremor while he is writing. Pt is right handed.    Objective:  Neurological Exam  Physical Exam Physical Examination:   Vitals:   03/24/17 1444  BP: 103/73  Pulse: 88    General Examination: The patient is a very pleasant 81 y.o. male in no acute distress. He appears well-developed and well-nourished and well groomed.   HEENT: Normocephalic, atraumatic, pupils are equal, round and reactive to light and accommodation. Extraocular tracking is good without limitation to gaze excursion or nystagmus noted. Normal smooth pursuit is noted. Hearing is grossly intact. Face is symmetric with normal facial animation and normal facial sensation. Speech is clear with no dysarthria noted. There is no hypophonia. There is no lip, neck/head, jaw or voice tremor. Neck is supple with full range of passive and active motion. There are no carotid bruits on auscultation. Oropharynx exam reveals: mild mouth dryness, adequate dental hygiene. Mallampati is class II. Tongue protrudes centrally and palate elevates symmetrically.   Chest: Clear to auscultation without wheezing, rhonchi or crackles noted.  Heart: S1+S2+0, regular and normal without murmurs, rubs or gallops noted.   Abdomen: Soft, non-tender and non-distended with normal bowel sounds appreciated on auscultation.  Extremities: There is no pitting edema in the distal lower extremities bilaterally.  Pedal pulses are intact.  Skin: Warm and dry without trophic changes noted.  Musculoskeletal: exam reveals no obvious joint deformities, tenderness or joint swelling or erythema.   Neurologically:  Mental status: The patient is awake, alert and oriented in all 4 spheres. His immediate and remote memory, attention, language skills and fund of knowledge are appropriate. There is no evidence of aphasia, agnosia, apraxia or anomia. Speech is clear with normal prosody and enunciation. Thought process is linear. Mood is normal and affect is normal.  Cranial nerves II - XII are as described above under HEENT exam. In addition: shoulder shrug is normal with equal shoulder height noted. Motor exam: Normal bulk, strength and tone is noted. There is no drift, resting tremor or rebound. He does not have a noticeable postural or action tremor. He has no resting tremor.  On 03/24/2017: On Archimedes spiral drawing he has minimal tremulousness bilaterally. Handwriting is moderately tremulous, difficult to  read, not micrographic.  Romberg is negative. Reflexes are 1+ throughout. Fine motor skills and coordination: intact with normal finger taps, normal hand movements, normal rapid alternating patting, normal foot taps and normal foot agility.  Cerebellar testing: No dysmetria or intention tremor on finger to nose testing. Heel to shin is unremarkable bilaterally. There is no truncal or gait ataxia.  Sensory exam: intact to light touch, vibration, temperature sense in the upper and lower extremities.  Gait, station and balance: He stands easily. No veering to one side is noted. No leaning to one side is noted. Posture is age-appropriate and stance is narrow based. Gait shows normal stride length and normal pace. No problems turning are noted. Tandem walk is unremarkable for age.               Assessment and Plan:  In summary, Travis Mercado is a very pleasant 81 y.o.-year old male with an underlying complex  medical history of hyperlipidemia, hypertension, TIA, type 2 diabetes, aortic aneurysm with status post repair, reflux disease, third-degree heart block with status post pacemaker placement, vitamin D deficiency, and obesity, who reports a hand tremor particularly affecting his right hand with handwriting for the past many years. He feels that with time his tremor has become worse and handwriting has become more and more difficult. On examination he has no significant resting, postural or action tremor with finger-to-nose testing or hand movements in general. He does have evidence of trembling with his handwriting. His family history is in keeping with essential tremor. His presentation is rather benign at this time, no signs of parkinsonism. I did not suggest trial of medication for his tremor at this time. He is already on a beta blocker but increasing this would reduce his blood pressure too much. Mysoline may affect him adversely with regards to causing him sleepiness and balance issues. I would like to suggest as needed follow-up and monitoring his tremor through primary care. He is agreeable. We talked about the challenges of managing tremor that affects handwriting. Often times when we utilize symptomatic medication for tremor control, it helps with postural and action tremor and resting tremor but does not impact significantly the ability to write any better. I answered all his questions and the patient was agreeable for an as needed follow-up.  Thank you very much for allowing me to participate in the care of this nice patient. If I can be of any further assistance to you please do not hesitate to call me at [phone].  Sincerely,   Star Age, MD, PhD

## 2017-03-24 NOTE — Patient Instructions (Signed)
Your exam is benign, you do have a tremor with your handwriting.  Otherwise, your exam looks rather good, no signs of parkinsonism or widespread tremor.  I can see you back as needed.

## 2017-04-03 ENCOUNTER — Telehealth: Payer: Self-pay | Admitting: Cardiology

## 2017-04-03 ENCOUNTER — Encounter: Payer: Medicare Other | Admitting: *Deleted

## 2017-04-03 NOTE — Telephone Encounter (Signed)
Spoke with pt and reminded pt of remote transmission that is due today. Pt verbalized understanding.   

## 2017-04-04 ENCOUNTER — Encounter: Payer: Self-pay | Admitting: Cardiology

## 2017-04-25 ENCOUNTER — Other Ambulatory Visit: Payer: Self-pay | Admitting: Cardiothoracic Surgery

## 2017-04-25 DIAGNOSIS — I712 Thoracic aortic aneurysm, without rupture, unspecified: Secondary | ICD-10-CM

## 2017-04-25 DIAGNOSIS — I7121 Aneurysm of the ascending aorta, without rupture: Secondary | ICD-10-CM

## 2017-05-21 ENCOUNTER — Ambulatory Visit: Payer: Medicare Other | Admitting: Cardiothoracic Surgery

## 2017-05-21 ENCOUNTER — Ambulatory Visit
Admission: RE | Admit: 2017-05-21 | Discharge: 2017-05-21 | Disposition: A | Discharge status: Home / Self Care | Payer: Medicare Other | Source: Ambulatory Visit | Attending: Cardiothoracic Surgery | Admitting: Cardiothoracic Surgery

## 2017-05-21 DIAGNOSIS — I712 Thoracic aortic aneurysm, without rupture, unspecified: Secondary | ICD-10-CM

## 2017-05-21 MED ORDER — IOPAMIDOL (ISOVUE-370) INJECTION 76%
75.0000 mL | Freq: Once | INTRAVENOUS | Status: AC | PRN
  Administered 2017-05-21: 75 mL via INTRAVENOUS

## 2017-06-11 ENCOUNTER — Ambulatory Visit: Payer: Medicare Other | Admitting: Cardiothoracic Surgery

## 2017-06-13 ENCOUNTER — Ambulatory Visit (INDEPENDENT_AMBULATORY_CARE_PROVIDER_SITE_OTHER): Payer: Medicare Other | Admitting: Cardiothoracic Surgery

## 2017-06-13 ENCOUNTER — Encounter: Payer: Self-pay | Admitting: Cardiothoracic Surgery

## 2017-06-13 ENCOUNTER — Other Ambulatory Visit: Payer: Self-pay

## 2017-06-13 VITALS — BP 143/86 | HR 84 | Resp 18 | Ht 66.0 in | Wt 226.0 lb

## 2017-06-13 DIAGNOSIS — I712 Thoracic aortic aneurysm, without rupture: Secondary | ICD-10-CM

## 2017-06-13 DIAGNOSIS — I7121 Aneurysm of the ascending aorta, without rupture: Secondary | ICD-10-CM

## 2017-06-13 NOTE — Progress Notes (Signed)
PCP is Patient, No Pcp Per Referring Provider is Virgel Manifold, MD  Chief Complaint  Patient presents with  . Follow-up    f/u Thoracic aortic aneurysm Chest CTA 05/21/2017    HPI: Scheduled office visit with CTA of the thoracic aorta for 18 month surveillance of known asymptomatic moderate fusiform ascending aneurysm. Since his last visit the patient had a pacemaker placed and is now on a beta blocker. His blood pressure remains in control. He denies any chest pain. He is still active going to a fitness center a few days a week.  CT of thoracic aorta shows the ascending aorta to be unchanged at 4.5 cm. There is no evidence of penetrating ulceration or intramural hematoma. Pulmonary window show no evidence of pleural effusion, pulmonary nodules, adenopathy.  Past Medical History:  Diagnosis Date  . DM2 (diabetes mellitus, type 2) ([place])   . GERD (gastroesophageal reflux disease)   . Heart murmur   . 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 HSV infection   . HTN (hypertension)   . Hyperlipidemia 06/05/2011  . Third degree heart block ([place]) 09/2015    Past Surgical History:  Procedure Laterality Date  . APPENDECTOMY    . EP IMPLANTABLE DEVICE N/A 10/11/2015   Procedure: Pacemaker Implant;  Surgeon: Will Meredith Leeds, MD;  Location: [facility];  Service: Cardiovascular;  Laterality: N/A;    Family History  Problem Relation Age of Onset  . Diabetes Mother   . Cancer Mother   . Diabetes Father   . Prostate cancer Father   . Heart disease Father   . Alzheimer's disease Father   . Prostate cancer Brother   . Hypertension Brother     Social History Social History   Tobacco Use  . Smoking status: Former Smoker    Types: Cigarettes    Last attempt to quit: 05/28/1983    Years since quitting: 34.0  . Smokeless tobacco: Never Used  Substance Use Topics  . Alcohol use: No  . Drug use: No    Current Outpatient Medications  Medication  Sig Dispense Refill  . amLODipine (NORVASC) 10 MG tablet Take 10 mg by mouth daily.    Marland Kitchen aspirin EC 81 MG EC tablet Take 1 tablet (81 mg total) by mouth daily.    . cholecalciferol (VITAMIN D) 1000 UNITS tablet Take 1,000 Units by mouth daily.    Marland Kitchen ezetimibe-simvastatin (VYTORIN) 10-40 MG per tablet Take 1 tablet by mouth at bedtime.    . hydrOXYzine (ATARAX/VISTARIL) 25 MG tablet Take 25 mg by mouth 3 (three) times daily as needed.    . pantoprazole (PROTONIX) 40 MG tablet Take 40 mg by mouth daily.    . metoprolol tartrate (LOPRESSOR) 25 MG tablet Take 1 tablet (25 mg total) by mouth 2 (two) times daily. 180 tablet 3   No current facility-administered medications for this visit.     No Known Allergies  Review of Systems  The patient has gained 5 date pounds since his last visit He denies dizziness or syncope He denies difficulty swallowing He denies chest pain or shortness of breath He denies abdominal pain or jaundice No ankle edema No transient weakness or change in vision  BP (!) 143/86 (BP Location: Left Arm, Patient Position: Sitting, Cuff Size: Large)   Pulse 84   Resp 18   Ht 5\' 6"  (1.676 m)   Wt 226 lb (102.5 kg)   SpO2 98% Comment: RA  BMI  36.48 kg/m  Physical Exam      Exam    General- alert and comfortable    Neck- no JVD, no cervical adenopathy palpable, no carotid bruit   Lungs- clear without rales, wheezes   Cor- regular rate and rhythm, no murmur , gallop   Abdomen- soft, non-tender   Extremities - warm, non-tender, minimal edema   Neuro- oriented, appropriate, no focal weakness   Diagnostic Tests: CT shows moderate 4.5 cm fusiform ascending aneurysm, stable since 2013  Impression: Stable moderate fusiform thoracic aneurysm He remains at low risk for aortic dissection and does not have indication for surgical resection  Plan: Continue current conservative management with blood pressure control. He is advised to lose 10:to15 pounds over the next  year. Return with surveillance CTA of the thoracic aorta in 18 months Len Childs, MD [facility] [phone]

## 2017-06-25 ENCOUNTER — Telehealth: Payer: Self-pay | Admitting: Cardiology

## 2017-06-25 NOTE — Telephone Encounter (Signed)
Walk in pt Form-Dept of [employer] forms Dropped off. Placed in [place].

## 2017-06-26 ENCOUNTER — Telehealth: Payer: Self-pay | Admitting: Cardiology

## 2017-06-26 NOTE — Telephone Encounter (Signed)
Called and spoke with Travis Mercado explained why Dr.Camnitz will not be the completing Physician for the [place] paperwork her dropped off. Also offered for patient to ask Dr.Van Trigt or his PCP physician to complete. Travis Mercado stated to me he did not need the copy of the paperwork he dropped off here I could get rid of it.    Sherri,RN will also be calling patient to better explain to patient what is going on and to also clarify that he does not need paperwork.

## 2017-06-30 DIAGNOSIS — Z736 Limitation of activities due to disability: Secondary | ICD-10-CM

## 2018-01-06 ENCOUNTER — Ambulatory Visit (INDEPENDENT_AMBULATORY_CARE_PROVIDER_SITE_OTHER): Payer: Medicare Other | Admitting: Cardiology

## 2018-01-06 ENCOUNTER — Encounter: Payer: Self-pay | Admitting: Cardiology

## 2018-01-06 VITALS — BP 110/78 | HR 67 | Ht 66.0 in | Wt 213.4 lb

## 2018-01-06 DIAGNOSIS — I442 Atrioventricular block, complete: Secondary | ICD-10-CM | POA: Diagnosis not present

## 2018-01-06 DIAGNOSIS — I10 Essential (primary) hypertension: Secondary | ICD-10-CM

## 2018-01-06 DIAGNOSIS — R001 Bradycardia, unspecified: Secondary | ICD-10-CM | POA: Diagnosis not present

## 2018-01-06 LAB — CUP PACEART INCLINIC DEVICE CHECK
Battery Remaining Longevity: 87 mo
Battery Voltage: 3.01 V
Brady Statistic AP VP Percent: 54.88 %
Brady Statistic AS VS Percent: 0 %
Brady Statistic RV Percent Paced: 99.99 %
Date Time Interrogation Session: 20190813122927
Implantable Lead Implant Date: 20170517
Implantable Lead Location: 753859
Implantable Lead Location: 753860
Implantable Pulse Generator Implant Date: 20170517
Lead Channel Impedance Value: 380 Ohm
Lead Channel Impedance Value: 551 Ohm
Lead Channel Pacing Threshold Amplitude: 0.5 V
Lead Channel Pacing Threshold Amplitude: 0.5 V
Lead Channel Sensing Intrinsic Amplitude: 18.125 mV
Lead Channel Sensing Intrinsic Amplitude: 2.125 mV
Lead Channel Setting Sensing Sensitivity: 2.8 mV
MDC IDC LEAD IMPLANT DT: 20170517
MDC IDC MSMT LEADCHNL RA IMPEDANCE VALUE: 361 Ohm
MDC IDC MSMT LEADCHNL RA PACING THRESHOLD PULSEWIDTH: 0.4 ms
MDC IDC MSMT LEADCHNL RA SENSING INTR AMPL: 2.625 mV
MDC IDC MSMT LEADCHNL RV IMPEDANCE VALUE: 475 Ohm
MDC IDC MSMT LEADCHNL RV PACING THRESHOLD PULSEWIDTH: 0.4 ms
MDC IDC MSMT LEADCHNL RV SENSING INTR AMPL: 24.75 mV
MDC IDC SET LEADCHNL RA PACING AMPLITUDE: 1.5 V
MDC IDC SET LEADCHNL RV PACING AMPLITUDE: 2.5 V
MDC IDC SET LEADCHNL RV PACING PULSEWIDTH: 0.4 ms
MDC IDC STAT BRADY AP VS PERCENT: 0 %
MDC IDC STAT BRADY AS VP PERCENT: 45.12 %
MDC IDC STAT BRADY RA PERCENT PACED: 54.55 %

## 2018-01-06 NOTE — Progress Notes (Signed)
Electrophysiology Office Note   Date:  01/06/2018   ID:  Taegen Delker, DOB Nov 21, 1935, MRN [id number]  PCP:  Patient, No Pcp Per  Primary Electrophysiologist:  Derrik Mceachern Meredith Leeds, MD    No chief complaint on file.    History of Present Illness: Rocky Gladden is a 82 y.o. male who presents today for electrophysiology evaluation.   History of HTN, HLD, CKD, DM and complete AV block.  Had pacemaker placed 10/11/15.    Today, denies symptoms of palpitations, chest pain, shortness of breath, orthopnea, PND, lower extremity edema, claudication, dizziness, presyncope, syncope, bleeding, or neurologic sequela. The patient is tolerating medications without difficulties.  Overall he is feeling well.  He has no major complaints today.  He is able to do all of his daily activities.  He continues to exercise mainly with swimming.  Is also been getting in the steam room in hot tub which she was told is okay activity.   Past Medical History:  Diagnosis Date  . DM2 (diabetes mellitus, type 2) ([place])   . GERD (gastroesophageal reflux disease)   . Heart murmur   . 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 HSV infection   . HTN (hypertension)   . Hyperlipidemia 06/05/2011  . Third degree heart block ([place]) 09/2015   Past Surgical History:  Procedure Laterality Date  . APPENDECTOMY    . EP IMPLANTABLE DEVICE N/A 10/11/2015   Procedure: Pacemaker Implant;  Surgeon: Tagen Brethauer Meredith Leeds, MD;  Location: [facility];  Service: Cardiovascular;  Laterality: N/A;     Current Outpatient Medications  Medication Sig Dispense Refill  . amLODipine (NORVASC) 10 MG tablet Take 10 mg by mouth daily.    Marland Kitchen aspirin EC 81 MG EC tablet Take 1 tablet (81 mg total) by mouth daily.    . cholecalciferol (VITAMIN D) 1000 UNITS tablet Take 1,000 Units by mouth daily.    Marland Kitchen ezetimibe-simvastatin (VYTORIN) 10-40 MG per tablet Take 1 tablet by mouth at bedtime.    . hydrOXYzine  (ATARAX/VISTARIL) 25 MG tablet Take 25 mg by mouth 3 (three) times daily as needed.    . metFORMIN (GLUCOPHAGE) 500 MG tablet Take 500 mg by mouth 2 (two) times daily.    . metoprolol tartrate (LOPRESSOR) 25 MG tablet Take 1 tablet (25 mg total) by mouth 2 (two) times daily. 180 tablet 3  . pantoprazole (PROTONIX) 40 MG tablet Take 40 mg by mouth daily.     No current facility-administered medications for this visit.     Allergies:   Patient has no known allergies.   Social History:  The patient  reports that he quit smoking about 34 years ago. His smoking use included cigarettes. He has never used smokeless tobacco. He reports that he does not drink alcohol or use drugs.   Family History:  The patient's family history includes Alzheimer's disease in his father; Cancer in his mother; Diabetes in his father and mother; Heart disease in his father; Hypertension in his brother; Prostate cancer in his brother and father.    ROS:  Please see the history of present illness.   Otherwise, review of systems is positive for rash, easy bruising.   All other systems are reviewed and negative.   PHYSICAL EXAM: VS:  BP 110/78   Pulse 67   Ht 5\' 6"  (1.676 m)   Wt 213 lb 6.4 oz (96.8 kg)   SpO2 96%   BMI  34.44 kg/m  , BMI Body mass index is 34.44 kg/m. GEN: Well nourished, well developed, in no acute distress  HEENT: normal  Neck: no JVD, carotid bruits, or masses Cardiac: RRR; no murmurs, rubs, or gallops,no edema  Respiratory:  clear to auscultation bilaterally, normal work of breathing GI: soft, nontender, nondistended, + BS MS: no deformity or atrophy  Skin: warm and dry, device site well healed Neuro:  Strength and sensation are intact Psych: euthymic mood, full affect  EKG:  EKG is ordered today. Personal review of the ekg ordered shows AV paced  Personal review of the device interrogation today. Results in [place]: 01/24/2017: BUN 16; Creatinine, Ser 1.67; Hemoglobin  13.5; Platelets 161; Potassium 3.8; Sodium 135    Lipid Panel     Component Value Date/Time   CHOL 132 10/11/2015 0525   TRIG 80 10/11/2015 0525   HDL 57 10/11/2015 0525   CHOLHDL 2.3 10/11/2015 0525   VLDL 16 10/11/2015 0525   LDLCALC 59 10/11/2015 0525     Wt Readings from Last 3 Encounters:  01/06/18 213 lb 6.4 oz (96.8 kg)  06/13/17 226 lb (102.5 kg)  03/24/17 223 lb (101.2 kg)      Other studies Reviewed: Additional studies/ records that were reviewed today include: TTE 10/11/15  Review of the above records today demonstrates:  - Left ventricle: The cavity size was normal. Wall thickness was   normal. Systolic function was normal. The estimated ejection   fraction was in the range of 60% to 65%. Wall motion was normal;   there were no regional wall motion abnormalities. Doppler   parameters are consistent with abnormal left ventricular   relaxation (grade 1 diastolic dysfunction). - Aortic valve: Trileaflet; moderately thickened, moderately   calcified leaflets. - Mitral valve: There was trivial regurgitation. - Tricuspid valve: There was mild regurgitation.   ASSESSMENT AND PLAN:  1.  Complete AV block: Medtronic pacemaker implanted 10/11/2015.  Properly.  He had one 12 beat run of ventricular tachycardia in February but no more.  No changes.  2. Hypertension: Controlled today.  No changes.  3. Hyperlipidemia: Continue Vytorin Current medicines are reviewed at length with the patient today.   The patient does not have concerns regarding his medicines.  The following changes were made today: None  Labs/ tests ordered today include:  Orders Placed This Encounter  Procedures  . EKG 12-Lead     Disposition:   FU with Dejohn Ibarra 12 months  Signed, Prakriti Carignan Meredith Leeds, MD  01/06/2018 10:52 AM     [facility] [address] [place] [address] [phone] (office) [phone] (fax)

## 2018-01-06 NOTE — Patient Instructions (Signed)
Medication Instructions:  Your physician recommends that you continue on your current medications as directed. Please refer to the Current Medication list given to you today.  *If you need a refill on your cardiac medications before your next appointment, please call your pharmacy*  Labwork: None ordered  Testing/Procedures: None ordered  Follow-Up: Remote monitoring is used to monitor your Pacemaker or ICD from home. This monitoring reduces the number of office visits required to check your device to one time per year. It allows [place] to keep an eye on the functioning of your device to ensure it is working properly. You are scheduled for a device check from home on 04/07/2018. You may send your transmission at any time that day. If you have a wireless device, the transmission will be sent automatically. After your physician reviews your transmission, you will receive a postcard with your next transmission date.  Your physician wants you to follow-up in: 1 year with Dr. Curt Bears.  You will receive a reminder letter in the mail two months in advance. If you don't receive a letter, please call our office to schedule the follow-up appointment.  Thank you for choosing [facility]!!   Trinidad Curet, RN [phone]  Any Other Special Instructions Will Be Listed Below (If Applicable).

## 2018-04-07 ENCOUNTER — Telehealth: Payer: Self-pay | Admitting: Cardiology

## 2018-04-07 ENCOUNTER — Ambulatory Visit (INDEPENDENT_AMBULATORY_CARE_PROVIDER_SITE_OTHER): Payer: Medicare Other | Admitting: *Deleted

## 2018-04-07 DIAGNOSIS — I442 Atrioventricular block, complete: Secondary | ICD-10-CM | POA: Diagnosis not present

## 2018-04-07 DIAGNOSIS — I10 Essential (primary) hypertension: Secondary | ICD-10-CM

## 2018-04-07 NOTE — Telephone Encounter (Signed)
Spoke with pt and reminded pt of remote transmission that is due today. Pt verbalized understanding.   

## 2018-04-07 NOTE — Progress Notes (Signed)
Remote pacemaker transmission.   

## 2018-04-13 ENCOUNTER — Other Ambulatory Visit: Payer: Self-pay

## 2018-05-29 DIAGNOSIS — R21 Rash and other nonspecific skin eruption: Secondary | ICD-10-CM | POA: Diagnosis not present

## 2018-05-29 DIAGNOSIS — I10 Essential (primary) hypertension: Secondary | ICD-10-CM | POA: Diagnosis not present

## 2018-05-29 DIAGNOSIS — E1121 Type 2 diabetes mellitus with diabetic nephropathy: Secondary | ICD-10-CM | POA: Diagnosis not present

## 2018-06-07 LAB — CUP PACEART REMOTE DEVICE CHECK
Battery Remaining Longevity: 85 mo
Brady Statistic AP VP Percent: 55.13 %
Brady Statistic AP VS Percent: 0.01 %
Brady Statistic AS VP Percent: 44.84 %
Brady Statistic AS VS Percent: 0.02 %
Brady Statistic RV Percent Paced: 99.96 %
Implantable Lead Implant Date: 20170517
Implantable Lead Location: 753859
Implantable Lead Model: 5076
Implantable Lead Model: 5076
Lead Channel Impedance Value: 361 Ohm
Lead Channel Impedance Value: 380 Ohm
Lead Channel Impedance Value: 399 Ohm
Lead Channel Impedance Value: 456 Ohm
Lead Channel Pacing Threshold Amplitude: 0.5 V
Lead Channel Pacing Threshold Pulse Width: 0.4 ms
Lead Channel Sensing Intrinsic Amplitude: 18.75 mV
Lead Channel Sensing Intrinsic Amplitude: 2.25 mV
Lead Channel Setting Pacing Amplitude: 1.5 V
Lead Channel Setting Pacing Amplitude: 2.5 V
Lead Channel Setting Sensing Sensitivity: 2.8 mV
MDC IDC LEAD IMPLANT DT: 20170517
MDC IDC LEAD LOCATION: 753860
MDC IDC MSMT BATTERY VOLTAGE: 3.01 V
MDC IDC MSMT LEADCHNL RA PACING THRESHOLD AMPLITUDE: 0.625 V
MDC IDC MSMT LEADCHNL RA PACING THRESHOLD PULSEWIDTH: 0.4 ms
MDC IDC MSMT LEADCHNL RA SENSING INTR AMPL: 2.25 mV
MDC IDC MSMT LEADCHNL RV SENSING INTR AMPL: 18.75 mV
MDC IDC PG IMPLANT DT: 20170517
MDC IDC SESS DTM: 20191113193045
MDC IDC SET LEADCHNL RV PACING PULSEWIDTH: 0.4 ms
MDC IDC STAT BRADY RA PERCENT PACED: 55.03 %

## 2018-06-24 DIAGNOSIS — I10 Essential (primary) hypertension: Secondary | ICD-10-CM | POA: Diagnosis not present

## 2018-06-24 DIAGNOSIS — E1121 Type 2 diabetes mellitus with diabetic nephropathy: Secondary | ICD-10-CM | POA: Diagnosis not present

## 2018-07-24 ENCOUNTER — Encounter: Payer: Self-pay | Admitting: Cardiology

## 2018-08-05 DIAGNOSIS — E782 Mixed hyperlipidemia: Secondary | ICD-10-CM | POA: Diagnosis not present

## 2018-08-05 DIAGNOSIS — E1121 Type 2 diabetes mellitus with diabetic nephropathy: Secondary | ICD-10-CM | POA: Diagnosis not present

## 2018-08-05 DIAGNOSIS — I10 Essential (primary) hypertension: Secondary | ICD-10-CM | POA: Diagnosis not present

## 2018-08-07 ENCOUNTER — Ambulatory Visit (INDEPENDENT_AMBULATORY_CARE_PROVIDER_SITE_OTHER): Payer: Medicare Other | Admitting: *Deleted

## 2018-08-07 DIAGNOSIS — I442 Atrioventricular block, complete: Secondary | ICD-10-CM | POA: Diagnosis not present

## 2018-08-08 LAB — CUP PACEART REMOTE DEVICE CHECK
Battery Remaining Longevity: 82 mo
Battery Voltage: 3.01 V
Brady Statistic AP VP Percent: 34.72 %
Brady Statistic AS VP Percent: 65.24 %
Brady Statistic AS VS Percent: 0.03 %
Brady Statistic RA Percent Paced: 34.45 %
Brady Statistic RV Percent Paced: 99.96 %
Date Time Interrogation Session: 20200313161407
Implantable Lead Implant Date: 20170517
Implantable Lead Implant Date: 20170517
Implantable Lead Location: 753859
Implantable Lead Location: 753860
Implantable Lead Model: 5076
Implantable Lead Model: 5076
Implantable Pulse Generator Implant Date: 20170517
Lead Channel Impedance Value: 399 Ohm
Lead Channel Impedance Value: 475 Ohm
Lead Channel Pacing Threshold Amplitude: 0.625 V
Lead Channel Pacing Threshold Pulse Width: 0.4 ms
Lead Channel Pacing Threshold Pulse Width: 0.4 ms
Lead Channel Sensing Intrinsic Amplitude: 2.875 mV
Lead Channel Sensing Intrinsic Amplitude: 2.875 mV
Lead Channel Sensing Intrinsic Amplitude: 23.375 mV
Lead Channel Sensing Intrinsic Amplitude: 23.375 mV
Lead Channel Setting Pacing Amplitude: 1.5 V
Lead Channel Setting Pacing Amplitude: 2.5 V
Lead Channel Setting Pacing Pulse Width: 0.4 ms
Lead Channel Setting Sensing Sensitivity: 2.8 mV
MDC IDC MSMT LEADCHNL RA IMPEDANCE VALUE: 361 Ohm
MDC IDC MSMT LEADCHNL RA IMPEDANCE VALUE: 399 Ohm
MDC IDC MSMT LEADCHNL RV PACING THRESHOLD AMPLITUDE: 0.5 V
MDC IDC STAT BRADY AP VS PERCENT: 0.01 %

## 2018-08-11 ENCOUNTER — Other Ambulatory Visit: Payer: Self-pay

## 2018-08-12 DIAGNOSIS — I10 Essential (primary) hypertension: Secondary | ICD-10-CM | POA: Diagnosis not present

## 2018-08-14 ENCOUNTER — Encounter: Payer: Self-pay | Admitting: Cardiology

## 2018-08-14 NOTE — Progress Notes (Signed)
Remote pacemaker transmission.   

## 2018-09-21 DIAGNOSIS — E782 Mixed hyperlipidemia: Secondary | ICD-10-CM | POA: Diagnosis not present

## 2018-09-21 DIAGNOSIS — E1121 Type 2 diabetes mellitus with diabetic nephropathy: Secondary | ICD-10-CM | POA: Diagnosis not present

## 2018-09-21 DIAGNOSIS — I10 Essential (primary) hypertension: Secondary | ICD-10-CM | POA: Diagnosis not present

## 2018-09-23 DIAGNOSIS — I10 Essential (primary) hypertension: Secondary | ICD-10-CM | POA: Diagnosis not present

## 2018-09-23 DIAGNOSIS — E1121 Type 2 diabetes mellitus with diabetic nephropathy: Secondary | ICD-10-CM | POA: Diagnosis not present

## 2018-09-29 ENCOUNTER — Other Ambulatory Visit: Payer: Self-pay | Admitting: Cardiothoracic Surgery

## 2018-09-29 DIAGNOSIS — I712 Thoracic aortic aneurysm, without rupture, unspecified: Secondary | ICD-10-CM

## 2018-09-29 DIAGNOSIS — I7121 Aneurysm of the ascending aorta, without rupture: Secondary | ICD-10-CM

## 2018-11-09 ENCOUNTER — Ambulatory Visit (INDEPENDENT_AMBULATORY_CARE_PROVIDER_SITE_OTHER): Payer: PRIVATE HEALTH INSURANCE | Admitting: *Deleted

## 2018-11-09 DIAGNOSIS — I10 Essential (primary) hypertension: Secondary | ICD-10-CM | POA: Diagnosis not present

## 2018-11-09 LAB — CUP PACEART REMOTE DEVICE CHECK
Battery Remaining Longevity: 73 mo
Battery Voltage: 3 V
Brady Statistic AP VP Percent: 38.67 %
Brady Statistic AP VS Percent: 0.01 %
Brady Statistic AS VP Percent: 61.3 %
Brady Statistic AS VS Percent: 0.02 %
Brady Statistic RA Percent Paced: 38.57 %
Brady Statistic RV Percent Paced: 99.97 %
Date Time Interrogation Session: 20200615165610
Implantable Lead Implant Date: 20170517
Implantable Lead Implant Date: 20170517
Implantable Lead Location: 753859
Implantable Lead Location: 753860
Implantable Lead Model: 5076
Implantable Lead Model: 5076
Implantable Pulse Generator Implant Date: 20170517
Lead Channel Impedance Value: 342 Ohm
Lead Channel Impedance Value: 361 Ohm
Lead Channel Impedance Value: 380 Ohm
Lead Channel Impedance Value: 437 Ohm
Lead Channel Pacing Threshold Amplitude: 0.5 V
Lead Channel Pacing Threshold Amplitude: 0.625 V
Lead Channel Pacing Threshold Pulse Width: 0.4 ms
Lead Channel Pacing Threshold Pulse Width: 0.4 ms
Lead Channel Sensing Intrinsic Amplitude: 2.375 mV
Lead Channel Sensing Intrinsic Amplitude: 2.375 mV
Lead Channel Sensing Intrinsic Amplitude: 22.25 mV
Lead Channel Sensing Intrinsic Amplitude: 22.25 mV
Lead Channel Setting Pacing Amplitude: 1.5 V
Lead Channel Setting Pacing Amplitude: 2.5 V
Lead Channel Setting Pacing Pulse Width: 0.4 ms
Lead Channel Setting Sensing Sensitivity: 2.8 mV

## 2018-11-13 ENCOUNTER — Telehealth: Payer: Self-pay | Admitting: *Deleted

## 2018-11-13 MED ORDER — METOPROLOL TARTRATE 50 MG PO TABS: 50.0000 mg | ORAL_TABLET | Freq: Two times a day (BID) | ORAL | 3 refills | Status: DC

## 2018-11-13 NOTE — Telephone Encounter (Signed)
Pt has been notified of remote check results. Pt has been advised per Dr. Curt Bears to increase Metoprolol Tartrate to 50 mg BID. Pt states he has only been taking Metoprolol Tart 25 mg 1 tablet daily. I explained to him that it is a twice a day dosage, that the Tartrate is the short acting version of Metoprolol. I did advise pt to follow recommendations per Dr. Baird Kay to increase Metoprolol Tart to 50 mg BID. I assured the pt that I will Dr. Curt Bears and his nurse Venida Jarvis know that he has only been taking 25 gm once a day and we will call back if we are going to change the dosage again. Pt is agreeable to plan of care to increase to 50 mg BID, [facility] Rx has [facility]. Pt thanked me for my help and the call.

## 2018-11-13 NOTE — Telephone Encounter (Signed)
-----   Message from Will Meredith Leeds, MD sent at 11/13/2018  8:57 AM EDT ----- Abnormal device interrogation reviewed.  Lead parameters and battery status stable.  NSVT noted. Increase metoprolol to 50 mg BID.

## 2018-11-16 ENCOUNTER — Other Ambulatory Visit: Payer: Self-pay

## 2018-11-16 NOTE — Patient Outreach (Signed)
[place] [facility]) Care Management  11/16/2018  Aloysuis Ribaudo May 10, 1936 [id number]   Medication Adherence call to Mr. Zeki Bedrosian HIPPA Compliant Voice message left with a call back number. Mr. Vinetta Bergamo is showing past due on Metformin Er 500 mg under [place].   [place] Management Direct Dial [phone]  Fax [phone] [email or web address]

## 2018-11-17 ENCOUNTER — Encounter: Payer: Self-pay | Admitting: Cardiology

## 2018-11-17 ENCOUNTER — Other Ambulatory Visit: Payer: Self-pay | Admitting: Internal Medicine

## 2018-11-17 DIAGNOSIS — Z20822 Contact with and (suspected) exposure to covid-19: Secondary | ICD-10-CM

## 2018-11-17 NOTE — Progress Notes (Signed)
Remote pacemaker transmission.   

## 2018-11-20 LAB — NOVEL CORONAVIRUS, NAA: SARS-CoV-2, NAA: NOT DETECTED

## 2018-11-30 ENCOUNTER — Other Ambulatory Visit: Payer: Self-pay

## 2018-11-30 NOTE — Patient Outreach (Signed)
[place] [facility]) Care Management  11/30/2018  Travis Mercado 1935/10/31 [id number]  Medication Adherence call to Mr. Travis Mercado [place] Identifiers Verify spoke with patient he is past due on Metformin Er 500 mg patient explain he is taking 2 tablets two times a day sometimes he only takes 2 tablets because of his A1C patient will order it when finished with what he has. Mr. Travis Mercado is showing past due under [place].  [place] Management Direct Dial [phone]  Fax [phone] [email or web address]

## 2018-12-09 ENCOUNTER — Other Ambulatory Visit: Payer: Self-pay

## 2018-12-09 ENCOUNTER — Ambulatory Visit
Admission: RE | Admit: 2018-12-09 | Discharge: 2018-12-09 | Disposition: A | Discharge status: Home / Self Care | Payer: PRIVATE HEALTH INSURANCE | Source: Ambulatory Visit | Attending: Cardiothoracic Surgery | Admitting: Cardiothoracic Surgery

## 2018-12-09 ENCOUNTER — Encounter: Payer: Self-pay | Admitting: Cardiothoracic Surgery

## 2018-12-09 ENCOUNTER — Ambulatory Visit (INDEPENDENT_AMBULATORY_CARE_PROVIDER_SITE_OTHER): Payer: Medicare Other | Admitting: Cardiothoracic Surgery

## 2018-12-09 VITALS — BP 128/80 | HR 86 | Temp 97.7°F | Resp 20 | Ht 66.0 in | Wt 213.0 lb

## 2018-12-09 DIAGNOSIS — I712 Thoracic aortic aneurysm, without rupture, unspecified: Secondary | ICD-10-CM

## 2018-12-09 DIAGNOSIS — I7121 Aneurysm of the ascending aorta, without rupture: Secondary | ICD-10-CM

## 2018-12-09 MED ORDER — IOPAMIDOL (ISOVUE-370) INJECTION 76%
75.0000 mL | Freq: Once | INTRAVENOUS | Status: AC | PRN
  Administered 2018-12-09: 75 mL via INTRAVENOUS

## 2018-12-09 NOTE — Progress Notes (Signed)
PCP is Deland Pretty, MD Referring Provider is Virgel Manifold, MD  Chief Complaint  Patient presents with  . Thoracic Aortic Aneurysm    1 year f/u with [facility] Chest    HPI: Schedule I year follow-up with [facility] for stable asymptomatic fusiform ascending aneurysm measuring approximate 4.4 cm. He has had no chest pain.  He has been compliant with his blood pressure medication.  He checks his blood pressure regularly.  He is committed to a exercise/walking schedule 30 minutes most days.  Review of his CT scan shows stable fusiform ascending aneurysm dilated to 4.3 cm.  No mural thickening hematoma or ulceration.  Lung windows are clear.  No pleural effusions.  Patient had echocardiogram 3 years ago showing normal LV function, trileaflet aortic valve without stenosis or insufficiency.  Past Medical History:  Diagnosis Date  . DM2 (diabetes mellitus, type 2) ([place])   . GERD (gastroesophageal reflux disease)   . Heart murmur   . 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 HSV infection   . HTN (hypertension)   . Hyperlipidemia 06/05/2011  . Third degree heart block ([place]) 09/2015    Past Surgical History:  Procedure Laterality Date  . APPENDECTOMY    . EP IMPLANTABLE DEVICE N/A 10/11/2015   Procedure: Pacemaker Implant;  Surgeon: Will Meredith Leeds, MD;  Location: [facility];  Service: Cardiovascular;  Laterality: N/A;    Family History  Problem Relation Age of Onset  . Diabetes Mother   . Cancer Mother   . Diabetes Father   . Prostate cancer Father   . Heart disease Father   . Alzheimer's disease Father   . Prostate cancer Brother   . Hypertension Brother     Social History Social History   Tobacco Use  . Smoking status: Former Smoker    Types: Cigarettes    Quit date: 05/28/1983    Years since quitting: 35.5  . Smokeless tobacco: Never Used  Substance Use Topics  . Alcohol use: No  . Drug use: No    Current Outpatient Medications   Medication Sig Dispense Refill  . amLODipine (NORVASC) 10 MG tablet Take 10 mg by mouth daily.    Marland Kitchen aspirin EC 81 MG EC tablet Take 1 tablet (81 mg total) by mouth daily.    . cholecalciferol (VITAMIN D) 1000 UNITS tablet Take 1,000 Units by mouth daily.    Marland Kitchen ezetimibe-simvastatin (VYTORIN) 10-40 MG per tablet Take 1 tablet by mouth at bedtime.    . hydrOXYzine (ATARAX/VISTARIL) 25 MG tablet Take 25 mg by mouth 3 (three) times daily as needed.    . metFORMIN (GLUCOPHAGE) 500 MG tablet Take 500 mg by mouth 2 (two) times daily.    . metoprolol tartrate (LOPRESSOR) 50 MG tablet Take 1 tablet (50 mg total) by mouth 2 (two) times daily. 180 tablet 3  . pantoprazole (PROTONIX) 40 MG tablet Take 40 mg by mouth daily.     No current facility-administered medications for this visit.     No Known Allergies  Review of Systems  Weight stable No headache or change in vision No presyncope or falls No chest pain or shortness of breath No edema No abdominal pain  BP 128/80   Pulse 86   Temp 97.7 F (36.5 C) (Skin)   Resp 20   Ht 5\' 6"  (1.676 m)   Wt 213 lb (96.6 kg)   SpO2 96% Comment: RA  BMI 34.38 kg/m  Physical Exam  Exam    General- alert and comfortable    Neck- no JVD, no cervical adenopathy palpable, no carotid bruit   Lungs- clear without rales, wheezes   Cor- regular rate and rhythm, no murmur , gallop   Abdomen- soft, non-tender   Extremities - warm, non-tender, minimal edema   Neuro- oriented, appropriate, no focal weakness   Diagnostic Tests: CT scan images performed today show stable smooth fusiform dilatation of ascending aorta measuring 4.3 cm diameter.  Impression: Patient continues to be at low risk for aortic dissection. With current dimensions of the ascending aorta risk of dissection is less than 1%.  Best treatment is blood pressure control, commitment to heart healthy lifestyle, and regular surveillance CT scans of the order. Plan: Patient will return  in 1.5 years for repeat scan and continue current meds and activity   Len Childs, MD [facility] [phone]

## 2018-12-23 DIAGNOSIS — I10 Essential (primary) hypertension: Secondary | ICD-10-CM | POA: Diagnosis not present

## 2018-12-23 DIAGNOSIS — N183 Chronic kidney disease, stage 3 (moderate): Secondary | ICD-10-CM | POA: Diagnosis not present

## 2018-12-23 DIAGNOSIS — E1121 Type 2 diabetes mellitus with diabetic nephropathy: Secondary | ICD-10-CM | POA: Diagnosis not present

## 2018-12-23 DIAGNOSIS — E782 Mixed hyperlipidemia: Secondary | ICD-10-CM | POA: Diagnosis not present

## 2019-01-19 ENCOUNTER — Ambulatory Visit (INDEPENDENT_AMBULATORY_CARE_PROVIDER_SITE_OTHER): Payer: Medicare Other | Admitting: Cardiology

## 2019-01-19 ENCOUNTER — Other Ambulatory Visit: Payer: Self-pay

## 2019-01-19 ENCOUNTER — Encounter: Payer: Self-pay | Admitting: Cardiology

## 2019-01-19 VITALS — BP 112/76 | HR 86 | Ht 66.0 in | Wt 216.0 lb

## 2019-01-19 DIAGNOSIS — I442 Atrioventricular block, complete: Secondary | ICD-10-CM

## 2019-01-19 NOTE — Patient Instructions (Addendum)
Medication Instructions:  Your physician recommends that you continue on your current medications as directed. Please refer to the Current Medication list given to you today.  *If you need a refill on your cardiac medications before your next appointment, please call your pharmacy*  Labwork: None ordered  Testing/Procedures: None ordered  Follow-Up: Remote monitoring is used to monitor your Pacemaker or ICD from home. This monitoring reduces the number of office visits required to check your device to one time per year. It allows [place] to keep an eye on the functioning of your device to ensure it is working properly. You are scheduled for a device check from home on 02/08/19. You may send your transmission at any time that day. If you have a wireless device, the transmission will be sent automatically. After your physician reviews your transmission, you will receive a postcard with your next transmission date.  Your physician wants you to follow-up in: 1 year with Dr. Curt Bears.  You will receive a reminder letter in the mail two months in advance. If you don't receive a letter, please call our office to schedule the follow-up appointment.  Thank you for choosing [facility]!!   Trinidad Curet, RN [phone]

## 2019-01-19 NOTE — Progress Notes (Signed)
Electrophysiology Office Note   Date:  01/19/2019   ID:  Travis Mercado, Travis Mercado 1935/09/17, MRN [id number]  PCP:  Deland Pretty, MD  Primary Electrophysiologist:  Constance Haw, MD    No chief complaint on file.    History of Present Illness: Travis Mercado is a 83 y.o. male who presents today for electrophysiology evaluation.   History of HTN, HLD, CKD, DM and complete AV block.  Had pacemaker placed 10/11/15.    Today, denies symptoms of palpitations, chest pain, shortness of breath, orthopnea, PND, lower extremity edema, claudication, dizziness, presyncope, syncope, bleeding, or neurologic sequela. The patient is tolerating medications without difficulties.  Overall he is feeling well.  He has no chest pain or shortness of breath.  He is able do all his daily activities without restriction.  Past Medical History:  Diagnosis Date  . DM2 (diabetes mellitus, type 2) ([place])   . GERD (gastroesophageal reflux disease)   . Heart murmur   . 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 HSV infection   . HTN (hypertension)   . Hyperlipidemia 06/05/2011  . Third degree heart block ([place]) 09/2015   Past Surgical History:  Procedure Laterality Date  . APPENDECTOMY    . EP IMPLANTABLE DEVICE N/A 10/11/2015   Procedure: Pacemaker Implant;  Surgeon: Roselind Klus Meredith Leeds, MD;  Location: [place] [facility];  Service: Cardiovascular;  Laterality: N/A;     Current Outpatient Medications  Medication Sig Dispense Refill  . amLODipine (NORVASC) 10 MG tablet Take 10 mg by mouth daily.    Marland Kitchen amlodipine-olmesartan (AZOR) 10-20 MG tablet Take 1 tablet by mouth daily.    Marland Kitchen aspirin EC 81 MG EC tablet Take 1 tablet (81 mg total) by mouth daily.    . cholecalciferol (VITAMIN D) 1000 UNITS tablet Take 1,000 Units by mouth daily.    Marland Kitchen ezetimibe-simvastatin (VYTORIN) 10-40 MG per tablet Take 1 tablet by mouth at bedtime.    . hydrOXYzine (ATARAX/VISTARIL) 25 MG tablet Take 25  mg by mouth 3 (three) times daily as needed.    . metFORMIN (GLUCOPHAGE) 500 MG tablet Take 500 mg by mouth 2 (two) times daily.    . metoprolol tartrate (LOPRESSOR) 50 MG tablet Take 1 tablet (50 mg total) by mouth 2 (two) times daily. 180 tablet 3  . pantoprazole (PROTONIX) 40 MG tablet Take 40 mg by mouth daily.     No current facility-administered medications for this visit.     Allergies:   Patient has no known allergies.   Social History:  The patient  reports that he quit smoking about 35 years ago. His smoking use included cigarettes. He has never used smokeless tobacco. He reports that he does not drink alcohol or use drugs.   Family History:  The patient's family history includes Alzheimer's disease in his father; Cancer in his mother; Diabetes in his father and mother; Heart disease in his father; Hypertension in his brother; Prostate cancer in his brother and father.    ROS:  Please see the history of present illness.   Otherwise, review of systems is positive for none.   All other systems are reviewed and negative.   PHYSICAL EXAM: VS:  BP 112/76   Pulse 86   Ht 5\' 6"  (1.676 m)   Wt 216 lb (98 kg)   SpO2 97%   BMI 34.86 kg/m  , BMI Body mass index is 34.86 kg/m. GEN: Well nourished, well  developed, in no acute distress  HEENT: normal  Neck: no JVD, carotid bruits, or masses Cardiac: RRR; no murmurs, rubs, or gallops,no edema  Respiratory:  clear to auscultation bilaterally, normal work of breathing GI: soft, nontender, nondistended, + BS MS: no deformity or atrophy  Skin: warm and dry, device site well healed Neuro:  Strength and sensation are intact Psych: euthymic mood, full affect  EKG:  EKG is ordered today. Personal review of the ekg ordered  shows AV paced  Personal review of the device interrogation today. Results in [place]: No results found for requested labs within last 8760 hours.    Lipid Panel     Component Value Date/Time    CHOL 132 10/11/2015 0525   TRIG 80 10/11/2015 0525   HDL 57 10/11/2015 0525   CHOLHDL 2.3 10/11/2015 0525   VLDL 16 10/11/2015 0525   LDLCALC 59 10/11/2015 0525     Wt Readings from Last 3 Encounters:  01/19/19 216 lb (98 kg)  12/09/18 213 lb (96.6 kg)  01/06/18 213 lb 6.4 oz (96.8 kg)      Other studies Reviewed: Additional studies/ records that were reviewed today include: TTE 10/11/15  Review of the above records today demonstrates:  - Left ventricle: The cavity size was normal. Wall thickness was   normal. Systolic function was normal. The estimated ejection   fraction was in the range of 60% to 65%. Wall motion was normal;   there were no regional wall motion abnormalities. Doppler   parameters are consistent with abnormal left ventricular   relaxation (grade 1 diastolic dysfunction). - Aortic valve: Trileaflet; moderately thickened, moderately   calcified leaflets. - Mitral valve: There was trivial regurgitation. - Tricuspid valve: There was mild regurgitation.   ASSESSMENT AND PLAN:  1.  Complete AV block: Chronic dual-chamber pacemaker implanted 10/11/2015.  Functioning appropriately.  No changes.    2. Hypertension: Well-controlled  3. Hyperlipidemia: Continue Vytorin per primary physician  Current medicines are reviewed at length with the patient today.   The patient does not have concerns regarding his medicines.  The following changes were made today: None  Labs/ tests ordered today include:  Orders Placed This Encounter  Procedures  . EKG 12-Lead     Disposition:   FU with Dartanion Teo 12 months  Signed, Maie Kesinger Meredith Leeds, MD  01/19/2019 3:46 PM     [place] [address] [phone] (office) [phone] (fax)

## 2019-02-08 ENCOUNTER — Ambulatory Visit (INDEPENDENT_AMBULATORY_CARE_PROVIDER_SITE_OTHER): Payer: Medicare Other | Admitting: *Deleted

## 2019-02-08 DIAGNOSIS — I442 Atrioventricular block, complete: Secondary | ICD-10-CM

## 2019-02-09 LAB — CUP PACEART REMOTE DEVICE CHECK
Battery Remaining Longevity: 71 mo
Battery Voltage: 3 V
Brady Statistic AP VP Percent: 92 %
Brady Statistic AP VS Percent: 0.02 %
Brady Statistic AS VP Percent: 7.98 %
Brady Statistic AS VS Percent: 0.01 %
Brady Statistic RA Percent Paced: 91.95 %
Brady Statistic RV Percent Paced: 99.97 %
Date Time Interrogation Session: 20200915174852
Implantable Lead Implant Date: 20170517
Implantable Lead Implant Date: 20170517
Implantable Lead Location: 753859
Implantable Lead Location: 753860
Implantable Lead Model: 5076
Implantable Lead Model: 5076
Implantable Pulse Generator Implant Date: 20170517
Lead Channel Impedance Value: 342 Ohm
Lead Channel Impedance Value: 380 Ohm
Lead Channel Impedance Value: 380 Ohm
Lead Channel Impedance Value: 456 Ohm
Lead Channel Pacing Threshold Amplitude: 0.5 V
Lead Channel Pacing Threshold Amplitude: 0.625 V
Lead Channel Pacing Threshold Pulse Width: 0.4 ms
Lead Channel Pacing Threshold Pulse Width: 0.4 ms
Lead Channel Sensing Intrinsic Amplitude: 18.25 mV
Lead Channel Sensing Intrinsic Amplitude: 18.25 mV
Lead Channel Sensing Intrinsic Amplitude: 2 mV
Lead Channel Sensing Intrinsic Amplitude: 2 mV
Lead Channel Setting Pacing Amplitude: 1.5 V
Lead Channel Setting Pacing Amplitude: 2.5 V
Lead Channel Setting Pacing Pulse Width: 0.4 ms
Lead Channel Setting Sensing Sensitivity: 2.8 mV

## 2019-02-19 ENCOUNTER — Encounter: Payer: Self-pay | Admitting: Cardiology

## 2019-02-19 NOTE — Progress Notes (Signed)
Remote pacemaker transmission.   

## 2019-03-29 DIAGNOSIS — E782 Mixed hyperlipidemia: Secondary | ICD-10-CM | POA: Diagnosis not present

## 2019-03-29 DIAGNOSIS — Z1159 Encounter for screening for other viral diseases: Secondary | ICD-10-CM | POA: Diagnosis not present

## 2019-03-29 DIAGNOSIS — I10 Essential (primary) hypertension: Secondary | ICD-10-CM | POA: Diagnosis not present

## 2019-03-29 DIAGNOSIS — E119 Type 2 diabetes mellitus without complications: Secondary | ICD-10-CM | POA: Diagnosis not present

## 2019-03-31 LAB — CUP PACEART INCLINIC DEVICE CHECK
Battery Remaining Longevity: 71 mo
Battery Voltage: 3 V
Brady Statistic AP VP Percent: 49.88 %
Brady Statistic AP VS Percent: 0.01 %
Brady Statistic AS VP Percent: 50.09 %
Brady Statistic AS VS Percent: 0.02 %
Brady Statistic RA Percent Paced: 49.68 %
Brady Statistic RV Percent Paced: 99.97 %
Date Time Interrogation Session: 20200825194613
Implantable Lead Implant Date: 20170517
Implantable Lead Implant Date: 20170517
Implantable Lead Location: 753859
Implantable Lead Location: 753860
Implantable Lead Model: 5076
Implantable Lead Model: 5076
Implantable Pulse Generator Implant Date: 20170517
Lead Channel Impedance Value: 342 Ohm
Lead Channel Impedance Value: 380 Ohm
Lead Channel Impedance Value: 380 Ohm
Lead Channel Impedance Value: 456 Ohm
Lead Channel Pacing Threshold Amplitude: 0.5 V
Lead Channel Pacing Threshold Amplitude: 0.75 V
Lead Channel Pacing Threshold Pulse Width: 0.4 ms
Lead Channel Pacing Threshold Pulse Width: 0.4 ms
Lead Channel Sensing Intrinsic Amplitude: 2.625 mV
Lead Channel Sensing Intrinsic Amplitude: 20.625 mV
Lead Channel Setting Pacing Amplitude: 1.5 V
Lead Channel Setting Pacing Amplitude: 2.5 V
Lead Channel Setting Pacing Pulse Width: 0.4 ms
Lead Channel Setting Sensing Sensitivity: 2.8 mV

## 2019-04-01 ENCOUNTER — Other Ambulatory Visit: Payer: Self-pay

## 2019-04-01 DIAGNOSIS — N183 Chronic kidney disease, stage 3 unspecified: Secondary | ICD-10-CM | POA: Diagnosis not present

## 2019-04-01 DIAGNOSIS — K219 Gastro-esophageal reflux disease without esophagitis: Secondary | ICD-10-CM | POA: Diagnosis not present

## 2019-04-01 DIAGNOSIS — Z1212 Encounter for screening for malignant neoplasm of rectum: Secondary | ICD-10-CM | POA: Diagnosis not present

## 2019-04-01 DIAGNOSIS — Z23 Encounter for immunization: Secondary | ICD-10-CM | POA: Diagnosis not present

## 2019-04-01 DIAGNOSIS — Z0000 Encounter for general adult medical examination without abnormal findings: Secondary | ICD-10-CM | POA: Diagnosis not present

## 2019-04-01 DIAGNOSIS — E782 Mixed hyperlipidemia: Secondary | ICD-10-CM | POA: Diagnosis not present

## 2019-04-01 DIAGNOSIS — Z20822 Contact with and (suspected) exposure to covid-19: Secondary | ICD-10-CM

## 2019-04-01 DIAGNOSIS — I712 Thoracic aortic aneurysm, without rupture: Secondary | ICD-10-CM | POA: Diagnosis not present

## 2019-04-01 DIAGNOSIS — I10 Essential (primary) hypertension: Secondary | ICD-10-CM | POA: Diagnosis not present

## 2019-04-02 LAB — NOVEL CORONAVIRUS, NAA: SARS-CoV-2, NAA: NOT DETECTED

## 2019-04-30 ENCOUNTER — Other Ambulatory Visit: Payer: Self-pay

## 2019-04-30 DIAGNOSIS — Z20822 Contact with and (suspected) exposure to covid-19: Secondary | ICD-10-CM

## 2019-05-01 LAB — NOVEL CORONAVIRUS, NAA: SARS-CoV-2, NAA: NOT DETECTED

## 2019-05-10 ENCOUNTER — Ambulatory Visit (INDEPENDENT_AMBULATORY_CARE_PROVIDER_SITE_OTHER): Payer: Medicare Other | Admitting: *Deleted

## 2019-05-10 DIAGNOSIS — I442 Atrioventricular block, complete: Secondary | ICD-10-CM

## 2019-05-10 LAB — CUP PACEART REMOTE DEVICE CHECK
Battery Remaining Longevity: 70 mo
Battery Voltage: 3 V
Brady Statistic AP VP Percent: 91.22 %
Brady Statistic AP VS Percent: 0.01 %
Brady Statistic AS VP Percent: 8.76 %
Brady Statistic AS VS Percent: 0.01 %
Brady Statistic RA Percent Paced: 91.09 %
Brady Statistic RV Percent Paced: 99.84 %
Date Time Interrogation Session: 20201214183149
Implantable Lead Implant Date: 20170517
Implantable Lead Implant Date: 20170517
Implantable Lead Location: 753859
Implantable Lead Location: 753860
Implantable Lead Model: 5076
Implantable Lead Model: 5076
Implantable Pulse Generator Implant Date: 20170517
Lead Channel Impedance Value: 361 Ohm
Lead Channel Impedance Value: 380 Ohm
Lead Channel Impedance Value: 399 Ohm
Lead Channel Impedance Value: 456 Ohm
Lead Channel Pacing Threshold Amplitude: 0.375 V
Lead Channel Pacing Threshold Amplitude: 0.625 V
Lead Channel Pacing Threshold Pulse Width: 0.4 ms
Lead Channel Pacing Threshold Pulse Width: 0.4 ms
Lead Channel Sensing Intrinsic Amplitude: 2.375 mV
Lead Channel Sensing Intrinsic Amplitude: 2.375 mV
Lead Channel Sensing Intrinsic Amplitude: 21.125 mV
Lead Channel Sensing Intrinsic Amplitude: 21.125 mV
Lead Channel Setting Pacing Amplitude: 1.5 V
Lead Channel Setting Pacing Amplitude: 2.5 V
Lead Channel Setting Pacing Pulse Width: 0.4 ms
Lead Channel Setting Sensing Sensitivity: 2.8 mV

## 2019-06-12 NOTE — Progress Notes (Signed)
PPM remote 

## 2019-06-20 ENCOUNTER — Ambulatory Visit: Payer: Medicare Other

## 2019-06-21 DIAGNOSIS — H35033 Hypertensive retinopathy, bilateral: Secondary | ICD-10-CM | POA: Diagnosis not present

## 2019-06-21 DIAGNOSIS — H2513 Age-related nuclear cataract, bilateral: Secondary | ICD-10-CM | POA: Diagnosis not present

## 2019-06-21 DIAGNOSIS — E119 Type 2 diabetes mellitus without complications: Secondary | ICD-10-CM | POA: Diagnosis not present

## 2019-07-14 DIAGNOSIS — D22112 Melanocytic nevi of right lower eyelid, including canthus: Secondary | ICD-10-CM | POA: Diagnosis not present

## 2019-07-14 DIAGNOSIS — E1121 Type 2 diabetes mellitus with diabetic nephropathy: Secondary | ICD-10-CM | POA: Diagnosis not present

## 2019-07-14 DIAGNOSIS — H35033 Hypertensive retinopathy, bilateral: Secondary | ICD-10-CM | POA: Diagnosis not present

## 2019-07-14 DIAGNOSIS — E113211 Type 2 diabetes mellitus with mild nonproliferative diabetic retinopathy with macular edema, right eye: Secondary | ICD-10-CM | POA: Diagnosis not present

## 2019-07-14 DIAGNOSIS — E782 Mixed hyperlipidemia: Secondary | ICD-10-CM | POA: Diagnosis not present

## 2019-07-14 DIAGNOSIS — H524 Presbyopia: Secondary | ICD-10-CM | POA: Diagnosis not present

## 2019-07-14 DIAGNOSIS — I10 Essential (primary) hypertension: Secondary | ICD-10-CM | POA: Diagnosis not present

## 2019-07-14 DIAGNOSIS — H2513 Age-related nuclear cataract, bilateral: Secondary | ICD-10-CM | POA: Diagnosis not present

## 2019-07-21 DIAGNOSIS — H2511 Age-related nuclear cataract, right eye: Secondary | ICD-10-CM | POA: Diagnosis not present

## 2019-07-21 DIAGNOSIS — H2513 Age-related nuclear cataract, bilateral: Secondary | ICD-10-CM | POA: Diagnosis not present

## 2019-07-26 ENCOUNTER — Telehealth: Payer: Self-pay | Admitting: *Deleted

## 2019-07-26 NOTE — Telephone Encounter (Signed)
Spoke with Anderson Malta at [facility]. No PPM instructions needed. Anderson Malta appreciative of call, no additional questions at this time.

## 2019-07-26 NOTE — Telephone Encounter (Signed)
   Primary Cardiologist: Dr. Allegra Lai  Chart reviewed as part of pre-operative protocol coverage. Cataract extractions are recognized in guidelines as low risk surgeries that do not typically require specific preoperative testing or holding of blood thinner therapy. Therefore, given past medical history and time since last visit, based on ACC/AHA guidelines, Travis Mercado would be at acceptable risk for the planned procedure without further cardiovascular testing.   I will route this recommendation to the requesting party via Epic fax function and remove from pre-op pool.  Please call with questions. Will forward to our device clinic staff to see if device clinic clearance is needed as well prior to the procedure.   [place], [place] 07/26/2019, 1:04 PM

## 2019-07-26 NOTE — Telephone Encounter (Signed)
   [facility] Pre-operative Risk Assessment    Request for surgical clearance:  1. What type of surgery is being performed? CATARACT SURGERY   2. When is this surgery scheduled? 08/03/19 AND 08/24/19   3. What type of clearance is required (medical clearance vs. Pharmacy clearance to hold med vs. Both)? MEDICAL  4. Are there any medications that need to be held prior to surgery and how long? NO NEED TO STOP ANY BLOOD THINNERS   5. Practice name and name of physician performing surgery? [facility]; DR. Bing Plume   6. What is your office phone number [phone]    7.   What is your office fax number [phone]  8.   Anesthesia type (None, local, MAC, general) ? NOT LISTED    Travis Mercado 07/26/2019, 8:56 AM  _________________________________________________________________   (provider comments below)

## 2019-08-02 NOTE — Telephone Encounter (Signed)
Anderson Malta from [facility] calling to check on status of clearance. States she has not heard anything.

## 2019-08-02 NOTE — Telephone Encounter (Signed)
Dr. Rachael Fee office gave new fax number. I have re-faxed clearance to fax number given [phone]. I have s/w Estill Bamberg at Dr. Rachael Fee office and informed her that I just re-faxed clearance to the fax number given today. Estill Bamberg thanked me for the help. I asked if they don't received fax in a little bit to please let our office know. I will remove from the pre op call back pool.

## 2019-08-02 NOTE — Telephone Encounter (Signed)
Will fax clearance again.

## 2019-08-02 NOTE — Telephone Encounter (Signed)
Follow up  Travis Mercado from [facility] called and she said they haven't receive the clearance for Pt's cataract surgery. She provided a new fax # [phone]

## 2019-08-03 DIAGNOSIS — H2511 Age-related nuclear cataract, right eye: Secondary | ICD-10-CM | POA: Diagnosis not present

## 2019-08-03 DIAGNOSIS — H2512 Age-related nuclear cataract, left eye: Secondary | ICD-10-CM | POA: Diagnosis not present

## 2019-08-03 DIAGNOSIS — H25811 Combined forms of age-related cataract, right eye: Secondary | ICD-10-CM | POA: Diagnosis not present

## 2019-08-09 ENCOUNTER — Ambulatory Visit (INDEPENDENT_AMBULATORY_CARE_PROVIDER_SITE_OTHER): Payer: Medicare Other | Admitting: *Deleted

## 2019-08-09 DIAGNOSIS — I442 Atrioventricular block, complete: Secondary | ICD-10-CM | POA: Diagnosis not present

## 2019-08-11 ENCOUNTER — Telehealth: Payer: Self-pay

## 2019-08-11 LAB — CUP PACEART REMOTE DEVICE CHECK
Battery Remaining Longevity: 61 mo
Battery Voltage: 3 V
Brady Statistic AP VP Percent: 86.2 %
Brady Statistic AP VS Percent: 0 %
Brady Statistic AS VP Percent: 13.79 %
Brady Statistic AS VS Percent: 0 %
Brady Statistic RA Percent Paced: 86.18 %
Brady Statistic RV Percent Paced: 99.99 %
Date Time Interrogation Session: 20210317111440
Implantable Lead Implant Date: 20170517
Implantable Lead Implant Date: 20170517
Implantable Lead Location: 753859
Implantable Lead Location: 753860
Implantable Lead Model: 5076
Implantable Lead Model: 5076
Implantable Pulse Generator Implant Date: 20170517
Lead Channel Impedance Value: 361 Ohm
Lead Channel Impedance Value: 361 Ohm
Lead Channel Impedance Value: 380 Ohm
Lead Channel Impedance Value: 418 Ohm
Lead Channel Pacing Threshold Amplitude: 0.625 V
Lead Channel Pacing Threshold Amplitude: 0.75 V
Lead Channel Pacing Threshold Pulse Width: 0.4 ms
Lead Channel Pacing Threshold Pulse Width: 0.4 ms
Lead Channel Sensing Intrinsic Amplitude: 2.125 mV
Lead Channel Sensing Intrinsic Amplitude: 2.125 mV
Lead Channel Sensing Intrinsic Amplitude: 23.75 mV
Lead Channel Sensing Intrinsic Amplitude: 23.75 mV
Lead Channel Setting Pacing Amplitude: 1.5 V
Lead Channel Setting Pacing Amplitude: 2.5 V
Lead Channel Setting Pacing Pulse Width: 0.4 ms
Lead Channel Setting Sensing Sensitivity: 2.8 mV

## 2019-08-11 NOTE — Progress Notes (Signed)
PPM Remote  

## 2019-08-11 NOTE — Telephone Encounter (Signed)
Left message for patient to remind of missed remote transmission.  

## 2019-08-12 DIAGNOSIS — H2512 Age-related nuclear cataract, left eye: Secondary | ICD-10-CM | POA: Diagnosis not present

## 2019-08-24 DIAGNOSIS — H2512 Age-related nuclear cataract, left eye: Secondary | ICD-10-CM | POA: Diagnosis not present

## 2019-08-24 DIAGNOSIS — H25812 Combined forms of age-related cataract, left eye: Secondary | ICD-10-CM | POA: Diagnosis not present

## 2019-10-19 DIAGNOSIS — E782 Mixed hyperlipidemia: Secondary | ICD-10-CM | POA: Diagnosis not present

## 2019-10-19 DIAGNOSIS — E1121 Type 2 diabetes mellitus with diabetic nephropathy: Secondary | ICD-10-CM | POA: Diagnosis not present

## 2019-10-19 DIAGNOSIS — I10 Essential (primary) hypertension: Secondary | ICD-10-CM | POA: Diagnosis not present

## 2019-10-21 DIAGNOSIS — I10 Essential (primary) hypertension: Secondary | ICD-10-CM | POA: Diagnosis not present

## 2019-10-21 DIAGNOSIS — E782 Mixed hyperlipidemia: Secondary | ICD-10-CM | POA: Diagnosis not present

## 2019-10-21 DIAGNOSIS — N183 Chronic kidney disease, stage 3 unspecified: Secondary | ICD-10-CM | POA: Diagnosis not present

## 2019-10-21 DIAGNOSIS — I712 Thoracic aortic aneurysm, without rupture: Secondary | ICD-10-CM | POA: Diagnosis not present

## 2019-10-21 DIAGNOSIS — E1121 Type 2 diabetes mellitus with diabetic nephropathy: Secondary | ICD-10-CM | POA: Diagnosis not present

## 2019-11-08 DIAGNOSIS — E1121 Type 2 diabetes mellitus with diabetic nephropathy: Secondary | ICD-10-CM | POA: Diagnosis not present

## 2019-11-08 DIAGNOSIS — I10 Essential (primary) hypertension: Secondary | ICD-10-CM | POA: Diagnosis not present

## 2019-11-08 DIAGNOSIS — E782 Mixed hyperlipidemia: Secondary | ICD-10-CM | POA: Diagnosis not present

## 2019-11-10 ENCOUNTER — Telehealth: Payer: Self-pay

## 2019-11-10 NOTE — Telephone Encounter (Signed)
Patient called letting us know that he is waiting for his new monitor to arrive and once it does he will call and send transmission

## 2019-11-18 ENCOUNTER — Ambulatory Visit (INDEPENDENT_AMBULATORY_CARE_PROVIDER_SITE_OTHER): Payer: Medicare Other | Admitting: *Deleted

## 2019-11-18 DIAGNOSIS — I442 Atrioventricular block, complete: Secondary | ICD-10-CM | POA: Diagnosis not present

## 2019-11-18 LAB — CUP PACEART REMOTE DEVICE CHECK
Battery Remaining Longevity: 61 mo
Battery Voltage: 3 V
Brady Statistic AP VP Percent: 85.91 %
Brady Statistic AP VS Percent: 0.01 %
Brady Statistic AS VP Percent: 14.08 %
Brady Statistic AS VS Percent: 0 %
Brady Statistic RA Percent Paced: 85.85 %
Brady Statistic RV Percent Paced: 99.94 %
Date Time Interrogation Session: 20210624154440
Implantable Lead Implant Date: 20170517
Implantable Lead Implant Date: 20170517
Implantable Lead Location: 753859
Implantable Lead Location: 753860
Implantable Lead Model: 5076
Implantable Lead Model: 5076
Implantable Pulse Generator Implant Date: 20170517
Lead Channel Impedance Value: 361 Ohm
Lead Channel Impedance Value: 380 Ohm
Lead Channel Impedance Value: 399 Ohm
Lead Channel Impedance Value: 456 Ohm
Lead Channel Pacing Threshold Amplitude: 0.625 V
Lead Channel Pacing Threshold Amplitude: 0.625 V
Lead Channel Pacing Threshold Pulse Width: 0.4 ms
Lead Channel Pacing Threshold Pulse Width: 0.4 ms
Lead Channel Sensing Intrinsic Amplitude: 2.25 mV
Lead Channel Sensing Intrinsic Amplitude: 2.25 mV
Lead Channel Sensing Intrinsic Amplitude: 23.75 mV
Lead Channel Sensing Intrinsic Amplitude: 23.75 mV
Lead Channel Setting Pacing Amplitude: 1.5 V
Lead Channel Setting Pacing Amplitude: 2.5 V
Lead Channel Setting Pacing Pulse Width: 0.4 ms
Lead Channel Setting Sensing Sensitivity: 2.8 mV

## 2019-11-19 NOTE — Progress Notes (Signed)
Remote pacemaker transmission.   

## 2019-12-09 ENCOUNTER — Other Ambulatory Visit: Payer: Self-pay | Admitting: Cardiology

## 2020-01-12 ENCOUNTER — Other Ambulatory Visit: Payer: Self-pay

## 2020-01-12 ENCOUNTER — Emergency Department (HOSPITAL_BASED_OUTPATIENT_CLINIC_OR_DEPARTMENT_OTHER): Payer: Medicare Other

## 2020-01-12 ENCOUNTER — Emergency Department (HOSPITAL_BASED_OUTPATIENT_CLINIC_OR_DEPARTMENT_OTHER)
Admission: EM | Admit: 2020-01-12 | Discharge: 2020-01-12 | Disposition: A | Discharge status: Home / Self Care | Payer: Medicare Other | Attending: Emergency Medicine | Admitting: Emergency Medicine

## 2020-01-12 ENCOUNTER — Encounter (HOSPITAL_BASED_OUTPATIENT_CLINIC_OR_DEPARTMENT_OTHER): Payer: Self-pay | Admitting: Emergency Medicine

## 2020-01-12 DIAGNOSIS — Z79899 Other long term (current) drug therapy: Secondary | ICD-10-CM | POA: Diagnosis not present

## 2020-01-12 DIAGNOSIS — I10 Essential (primary) hypertension: Secondary | ICD-10-CM | POA: Insufficient documentation

## 2020-01-12 DIAGNOSIS — R109 Unspecified abdominal pain: Secondary | ICD-10-CM | POA: Diagnosis present

## 2020-01-12 DIAGNOSIS — E119 Type 2 diabetes mellitus without complications: Secondary | ICD-10-CM | POA: Insufficient documentation

## 2020-01-12 DIAGNOSIS — F17210 Nicotine dependence, cigarettes, uncomplicated: Secondary | ICD-10-CM | POA: Insufficient documentation

## 2020-01-12 DIAGNOSIS — N201 Calculus of ureter: Secondary | ICD-10-CM | POA: Diagnosis not present

## 2020-01-12 DIAGNOSIS — Z7982 Long term (current) use of aspirin: Secondary | ICD-10-CM | POA: Insufficient documentation

## 2020-01-12 DIAGNOSIS — N132 Hydronephrosis with renal and ureteral calculous obstruction: Secondary | ICD-10-CM | POA: Diagnosis not present

## 2020-01-12 DIAGNOSIS — Z7984 Long term (current) use of oral hypoglycemic drugs: Secondary | ICD-10-CM | POA: Insufficient documentation

## 2020-01-12 LAB — URINALYSIS, MICROSCOPIC (REFLEX): RBC / HPF: >50 RBC/hpf (ref 0–5)

## 2020-01-12 LAB — CBC WITH DIFFERENTIAL/PLATELET
Abs Immature Granulocytes: 0.06 10*3/uL (ref 0.00–0.07)
Basophils Absolute: 0 10*3/uL (ref 0.0–0.1)
Basophils Relative: 0 %
Eosinophils Absolute: 0.1 10*3/uL (ref 0.0–0.5)
Eosinophils Relative: 1 %
HCT: 39.3 % (ref 39.0–52.0)
Hemoglobin: 13.5 g/dL (ref 13.0–17.0)
Immature Granulocytes: 1 %
Lymphocytes Relative: 13 %
Lymphs Abs: 1.4 10*3/uL (ref 0.7–4.0)
MCH: 26 pg (ref 26.0–34.0)
MCHC: 34.4 g/dL (ref 30.0–36.0)
MCV: 75.7 fL — ABNORMAL LOW (ref 80.0–100.0)
Monocytes Absolute: 0.9 10*3/uL (ref 0.1–1.0)
Monocytes Relative: 8 %
Neutro Abs: 8.6 10*3/uL — ABNORMAL HIGH (ref 1.7–7.7)
Neutrophils Relative %: 77 %
Platelets: 175 10*3/uL (ref 150–400)
RBC: 5.19 MIL/uL (ref 4.22–5.81)
RDW: 14.7 % (ref 11.5–15.5)
WBC: 11.2 10*3/uL — ABNORMAL HIGH (ref 4.0–10.5)
nRBC: 0 % (ref 0.0–0.2)

## 2020-01-12 LAB — URINALYSIS, ROUTINE W REFLEX MICROSCOPIC
Bilirubin Urine: NEGATIVE
Glucose, UA: NEGATIVE mg/dL
Ketones, ur: 15 mg/dL — AB
Leukocytes,Ua: NEGATIVE
Nitrite: NEGATIVE
Protein, ur: NEGATIVE mg/dL
Specific Gravity, Urine: >1.03 — ABNORMAL HIGH (ref 1.005–1.030)
pH: 5.5 (ref 5.0–8.0)

## 2020-01-12 LAB — BASIC METABOLIC PANEL
Anion gap: 11 (ref 5–15)
BUN: 21 mg/dL (ref 8–23)
CO2: 23 mmol/L (ref 22–32)
Calcium: 8.8 mg/dL — ABNORMAL LOW (ref 8.9–10.3)
Chloride: 101 mmol/L (ref 98–111)
Creatinine, Ser: 1.84 mg/dL — ABNORMAL HIGH (ref 0.61–1.24)
GFR calc Af Amer: 39 mL/min — ABNORMAL LOW (ref >60)
GFR calc non Af Amer: 34 mL/min — ABNORMAL LOW (ref >60)
Glucose, Bld: 162 mg/dL — ABNORMAL HIGH (ref 70–99)
Potassium: 4 mmol/L (ref 3.5–5.1)
Sodium: 135 mmol/L (ref 135–145)

## 2020-01-12 MED ORDER — KETOROLAC TROMETHAMINE 15 MG/ML IJ SOLN: 7.5000 mg | Freq: Once | INTRAMUSCULAR | Status: AC

## 2020-01-12 MED ORDER — ONDANSETRON HCL 4 MG/2ML IJ SOLN
INTRAMUSCULAR | Status: AC
  Filled 2020-01-12: qty 2

## 2020-01-12 MED ORDER — ONDANSETRON HCL 4 MG/2ML IJ SOLN
4.0000 mg | Freq: Once | INTRAMUSCULAR | Status: AC
  Administered 2020-01-12: 4 mg via INTRAVENOUS

## 2020-01-12 MED ORDER — ONDANSETRON HCL 4 MG PO TABS: 4.0000 mg | ORAL_TABLET | Freq: Four times a day (QID) | ORAL | 0 refills | Status: DC

## 2020-01-12 MED ORDER — FENTANYL CITRATE (PF) 100 MCG/2ML IJ SOLN
INTRAMUSCULAR | Status: AC
  Filled 2020-01-12: qty 2

## 2020-01-12 MED ORDER — HYDROMORPHONE HCL 1 MG/ML IJ SOLN
1.0000 mg | Freq: Once | INTRAMUSCULAR | Status: AC
  Administered 2020-01-12: 1 mg via INTRAVENOUS
  Filled 2020-01-12: qty 1

## 2020-01-12 MED ORDER — FENTANYL CITRATE (PF) 100 MCG/2ML IJ SOLN
50.0000 ug | Freq: Once | INTRAMUSCULAR | Status: AC
  Administered 2020-01-12: 50 ug via INTRAVENOUS

## 2020-01-12 MED ORDER — SODIUM CHLORIDE 0.9 % IV BOLUS
500.0000 mL | Freq: Once | INTRAVENOUS | Status: AC
  Administered 2020-01-12: 500 mL via INTRAVENOUS

## 2020-01-12 MED ORDER — KETOROLAC TROMETHAMINE 15 MG/ML IJ SOLN
INTRAMUSCULAR | Status: AC
  Administered 2020-01-12: 7.5 mg via INTRAVENOUS
  Filled 2020-01-12: qty 1

## 2020-01-12 MED ORDER — OXYCODONE-ACETAMINOPHEN 5-325 MG PO TABS: 1.0000 | ORAL_TABLET | ORAL | 0 refills | Status: DC | PRN

## 2020-01-12 NOTE — ED Notes (Signed)
Patient is resting comfortably. 

## 2020-01-12 NOTE — ED Provider Notes (Signed)
7:35 AM Assumed care at change of shift. CT showing obstructing 29mm ureteral stone. Received a dose of fentanyl. Unfortunately, still having significant discomfort. Renal function appears close to baseline. Low dose toradol and will try opiate with longer half life. UA pending. Afebrile. No dysuria. Minimal leukocytosis but this could be reactive. Radiographic findings noted but clinically I'm not convinced that his is an infected stone.   UA ok. Pain now essentially gone. Expectant management. Urology FU.    Virgel Manifold, MD 01/18/20 1124

## 2020-01-12 NOTE — ED Notes (Signed)
Pt discharged to home. Discharge instructions have been discussed with patient and/or family members. Pt verbally acknowledges understanding d/c instructions, and endorses comprehension to checkout at registration before leaving.  °

## 2020-01-12 NOTE — ED Provider Notes (Signed)
[facility] Provider Note: Georgena Spurling, MD, FACEP  CSN: [id number] MRN: [id number] ARRIVAL: 01/12/20 at 0545 ROOM: MHOTF/OTF   CHIEF COMPLAINT  Abdominal Pain   HISTORY OF PRESENT ILLNESS  01/12/20 6:02 AM Travis Mercado is a 84 y.o. male who awakened with right flank pain at 2 AM today.  He rates the pain is a 5 out of 10 and describes it as aching.  It is not better or worse with movement or position.  He has had associated nausea and retching but no frank vomiting.  He has not noticed hematuria or dysuria.  He has no history of nephrolithiasis.   Past Medical History:  Diagnosis Date  . DM2 (diabetes mellitus, type 2) ([place])   . GERD (gastroesophageal reflux disease)   . Heart murmur   . 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 HSV infection   . HTN (hypertension)   . Hyperlipidemia 06/05/2011  . Third degree heart block ([place]) 09/2015    Past Surgical History:  Procedure Laterality Date  . APPENDECTOMY    . EP IMPLANTABLE DEVICE N/A 10/11/2015   Procedure: Pacemaker Implant;  Surgeon: Will Meredith Leeds, MD;  Location: [facility];  Service: Cardiovascular;  Laterality: N/A;    Family History  Problem Relation Age of Onset  . Diabetes Mother   . Cancer Mother   . Diabetes Father   . Prostate cancer Father   . Heart disease Father   . Alzheimer's disease Father   . Prostate cancer Brother   . Hypertension Brother     Social History   Tobacco Use  . Smoking status: Former Smoker    Types: Cigarettes    Quit date: 05/28/1983    Years since quitting: 36.6  . Smokeless tobacco: Never Used  Vaping Use  . Vaping Use: Never used  Substance Use Topics  . Alcohol use: No  . Drug use: No    Prior to Admission medications   Medication Sig Start Date End Date Taking? Authorizing Provider  amLODipine (NORVASC) 10 MG tablet Take 10 mg by mouth daily.    [provider]  amlodipine-olmesartan (AZOR) 10-20  MG tablet Take 1 tablet by mouth daily. 12/04/18   [provider]  aspirin EC 81 MG EC tablet Take 1 tablet (81 mg total) by mouth daily. 10/12/15   Chanetta Marshall K, NP  cholecalciferol (VITAMIN D) 1000 UNITS tablet Take 1,000 Units by mouth daily.    [provider]  ezetimibe-simvastatin (VYTORIN) 10-40 MG per tablet Take 1 tablet by mouth at bedtime.    [provider]  hydrOXYzine (ATARAX/VISTARIL) 25 MG tablet Take 25 mg by mouth 3 (three) times daily as needed.    [provider]  metFORMIN (GLUCOPHAGE) 500 MG tablet Take 500 mg by mouth 2 (two) times daily.    [provider]  metoprolol tartrate (LOPRESSOR) 50 MG tablet Take 1 tablet (50 mg total) by mouth 2 (two) times daily. Pt needs to make appt for further refills - 1st attempt 12/09/19   Constance Haw, MD  ondansetron (ZOFRAN) 4 MG tablet Take 1 tablet (4 mg total) by mouth every 6 (six) hours. 01/12/20   Virgel Manifold, MD  oxyCODONE-acetaminophen (PERCOCET/ROXICET) 5-325 MG tablet Take 1 tablet by mouth every 4 (four) hours as needed for severe pain. 01/12/20   Virgel Manifold, MD  pantoprazole (PROTONIX) 40 MG tablet Take 40 mg by mouth daily.  [provider]    Allergies Patient has no known allergies.   REVIEW OF SYSTEMS  Negative except as noted here or in the History of Present Illness.   PHYSICAL EXAMINATION  Initial Vital Signs Blood pressure (!) 154/77, pulse 77, temperature 98.8 F (37.1 C), temperature source Oral, resp. rate 14, height 5\' 6"  (1.676 m), weight 97.5 kg, SpO2 100 %.  Examination General: Well-developed, well-nourished male in no acute distress; appearance consistent with age of record HENT: normocephalic; atraumatic Eyes: pupils equal, round and reactive to light; extraocular muscles intact; arcus senilis bilaterally Neck: supple Heart: regular rate and rhythm Lungs: clear to auscultation bilaterally Abdomen: soft; nondistended;  nontender; bowel sounds present GU: No CVA tenderness Extremities: No deformity; full range of motion; pulses normal Neurologic: Awake, alert and oriented; motor function intact in all extremities and symmetric; no facial droop Skin: Warm and dry Psychiatric: Normal mood and affect   RESULTS  Summary of this visit's results, reviewed and interpreted by myself:   EKG Interpretation  Date/Time:    Ventricular Rate:    PR Interval:    QRS Duration:   QT Interval:    QTC Calculation:   R Axis:     Text Interpretation:        Laboratory Studies: Results for orders placed or performed during the hospital encounter of 01/12/20 (from the past 24 hour(s))  CBC with Differential/Platelet     Status: Abnormal   Collection Time: 01/12/20  6:46 AM  Result Value Ref Range   WBC 11.2 (H) 4.0 - 10.5 K/uL   RBC 5.19 4.22 - 5.81 MIL/uL   Hemoglobin 13.5 13.0 - 17.0 g/dL   HCT 39.3 39 - 52 %   MCV 75.7 (L) 80.0 - 100.0 fL   MCH 26.0 26.0 - 34.0 pg   MCHC 34.4 30.0 - 36.0 g/dL   RDW 14.7 11.5 - 15.5 %   Platelets 175 150 - 400 K/uL   nRBC 0.0 0.0 - 0.2 %   Neutrophils Relative % 77 %   Neutro Abs 8.6 (H) 1.7 - 7.7 K/uL   Lymphocytes Relative 13 %   Lymphs Abs 1.4 0.7 - 4.0 K/uL   Monocytes Relative 8 %   Monocytes Absolute 0.9 0 - 1 K/uL   Eosinophils Relative 1 %   Eosinophils Absolute 0.1 0 - 0 K/uL   Basophils Relative 0 %   Basophils Absolute 0.0 0 - 0 K/uL   Immature Granulocytes 1 %   Abs Immature Granulocytes 0.06 0.00 - 0.07 K/uL  Basic metabolic panel     Status: Abnormal   Collection Time: 01/12/20  6:46 AM  Result Value Ref Range   Sodium 135 135 - 145 mmol/L   Potassium 4.0 3.5 - 5.1 mmol/L   Chloride 101 98 - 111 mmol/L   CO2 23 22 - 32 mmol/L   Glucose, Bld 162 (H) 70 - 99 mg/dL   BUN 21 8 - 23 mg/dL   Creatinine, Ser 1.84 (H) 0.61 - 1.24 mg/dL   Calcium 8.8 (L) 8.9 - 10.3 mg/dL   GFR calc non Af Amer 34 (L) >60 mL/min   GFR calc Af Amer 39 (L) >60 mL/min    Anion gap 11 5 - 15  Urinalysis, Routine w reflex microscopic     Status: Abnormal   Collection Time: 01/12/20 12:00 PM  Result Value Ref Range   Color, Urine YELLOW YELLOW   APPearance CLEAR CLEAR   Specific Gravity, Urine >1.030 (H) 1.005 -  1.030   pH 5.5 5.0 - 8.0   Glucose, UA NEGATIVE NEGATIVE mg/dL   Hgb urine dipstick LARGE (A) NEGATIVE   Bilirubin Urine NEGATIVE NEGATIVE   Ketones, ur 15 (A) NEGATIVE mg/dL   Protein, ur NEGATIVE NEGATIVE mg/dL   Nitrite NEGATIVE NEGATIVE   Leukocytes,Ua NEGATIVE NEGATIVE  Urinalysis, Microscopic (reflex)     Status: Abnormal   Collection Time: 01/12/20 12:00 PM  Result Value Ref Range   RBC / HPF >50 0 - 5 RBC/hpf   WBC, UA 0-5 0 - 5 WBC/hpf   Bacteria, UA FEW (A) NONE SEEN   Squamous Epithelial / LPF 0-5 0 - 5   Imaging Studies: CT Renal Stone Study  Result Date: 01/12/2020 CLINICAL DATA:  Flank pain, stone disease suspected EXAM: CT ABDOMEN AND PELVIS WITHOUT CONTRAST TECHNIQUE: Multidetector CT imaging of the abdomen and pelvis was performed following the standard protocol without IV contrast. COMPARISON:  CT 10/16/2011 FINDINGS: Lower chest: Atelectatic changes are present in the otherwise clear lung bases. Normal cardiac size. Cardiac pacer leads are positioned at the right atrium and cardiac apex within the right ventricle. No pericardial effusion. Hepatobiliary: No visible concerning liver lesion on this unenhanced CT. Normal hepatic attenuation. Smooth liver surface contour. Few prominent fold in the gallbladder without pericholecystic inflammation or fluid. No visible calcified gallstones. No biliary ductal dilatation. Pancreas: Unremarkable. No pancreatic ductal dilatation or surrounding inflammatory changes. Spleen: Normal in size. No concerning splenic lesions. Adrenals/Urinary Tract: Normal adrenal glands. There is bilateral perinephric stranding, while nonspecific and the left given similarity to comparison imaging as remote is 7353,  this is certainly increased on the right with some associated moderate right hydroureteronephrosis of both upper and lower pole moieties with a bifid ureter which appear to conjoin in the mid ureter just proximal to a 5 mm calculus (2/63). No additional distal ureteral calculi. No obstructive left urolithiasis or urinary tract dilatation. Some chronic mild intramural fat within the anterior urinary bladder. Circumferential bladder wall thickening is nonspecific given some mild underdistention. Stomach/Bowel: Small sliding-type hiatal hernia. Intramural fat within the stomach, may reflect sequela of chronic gastritis. No acute gastric inflammation. Duodenum is unremarkable. No small bowel thickening or dilatation. Appendix is not visualized. No focal inflammation the vicinity of the cecum to suggest an occult appendicitis. No colonic dilatation or wall thickening. Few noninflamed colonic diverticula. No evidence of obstruction. Vascular/Lymphatic: Atherosclerotic calcifications within the abdominal aorta and branch vessels. No aneurysm or ectasia. No enlarged abdominopelvic lymph nodes. Reproductive: Mild prostatomegaly. Seminal vesicles are unremarkable. No concerning focal lesions. Vascular calcifications about the ductus deferens quite typical for patients with history of diabetes. Other: Asymmetrically increased right perinephric stranding. No free fluid or air in the abdomen or pelvis. No bowel containing hernias. Mild body wall edema. Musculoskeletal: Multilevel degenerative changes are present in the imaged portions of the spine. No acute osseous abnormality or suspicious osseous lesion. Partial fusion across the T10-T11 vertebral bodies, similar to comparison. Additional degenerative changes in the hips and pelvis. IMPRESSION: 1. Obstructing 5 mm calculus in the mid right ureter with some associated moderate right hydroureteronephrosis of both upper and lower pole moieties with incidentally noted bifid ureter  more proximally. 2. Circumferential bladder wall thickening is nonspecific given some mild underdistention and prostatomegaly. Correlate with urinalysis to exclude a superimposed cystitis and ascending tract infection. 3. Intramural fat within the stomach, may reflect sequela of chronic gastritis. 4. Colonic diverticulosis without evidence of acute diverticulitis. 5. Aortic Atherosclerosis (ICD10-I70.0). Electronically Signed  By: Lovena Le M.D.   On: 01/12/2020 06:36    ED COURSE and MDM  Nursing notes, initial and subsequent vitals signs, including pulse oximetry, reviewed and interpreted by myself.  Vitals:   01/12/20 0830 01/12/20 0901 01/12/20 1202 01/12/20 1300  BP: 125/79 128/69 139/78 (!) 147/78  Pulse: 70 65 65 64  Resp: 16  18   Temp:      TempSrc:      SpO2: 92% 92% 98% 100%  Weight:      Height:       Medications  ondansetron (ZOFRAN) injection 4 mg (4 mg Intravenous Given 01/12/20 0641)  fentaNYL (SUBLIMAZE) injection 50 mcg (50 mcg Intravenous Given 01/12/20 0641)  ketorolac (TORADOL) 15 MG/ML injection 7.5 mg (7.5 mg Intravenous Given 01/12/20 0749)  HYDROmorphone (DILAUDID) injection 1 mg (1 mg Intravenous Given 01/12/20 0749)  sodium chloride 0.9 % bolus 500 mL ( Intravenous Stopped 01/12/20 1123)   7:04 AM Awaiting laboratory studies.  Patient has a right ureteral stone which will likely require urologic consultation.  Patient signed out to Dr. Wilson Singer.   PROCEDURES  Procedures   ED DIAGNOSES     ICD-10-CM   1. Ureterolithiasis  N20.1        Travis Dimaria, MD 01/12/20 2239

## 2020-01-12 NOTE — ED Triage Notes (Signed)
Pt is c/o pain in his right side that woke him up at 2 am  Pt has nausea without vomiting  States the pain is uncomfortable  Does not radiate into his back or groin

## 2020-01-17 DIAGNOSIS — N201 Calculus of ureter: Secondary | ICD-10-CM | POA: Diagnosis not present

## 2020-01-18 ENCOUNTER — Telehealth: Payer: Self-pay | Admitting: Cardiology

## 2020-01-18 ENCOUNTER — Other Ambulatory Visit: Payer: Self-pay | Admitting: Urology

## 2020-01-18 NOTE — Telephone Encounter (Signed)
   [facility] Pre-operative Risk Assessment    Request for surgical clearance:  1. What type of surgery is being performed? Right Extracorporeal Shock Wave Lithotripsy   2. When is this surgery scheduled?  TDB   3. What type of clearance is required (medical clearance vs. Pharmacy clearance to hold med vs. Both)? Medical, Pharmacy and Pacemaker Clearance   4. Are there any medications that need to be held prior to surgery and how long? Asprin 61m to be held 72 hours prior.   5. Practice name and name of physician performing surgery? Dr. CEllison Hughswith [facility]   6. What is your office phone number? [phone]  7.   What is your office fax number? [phone] 8.   Anesthesia type (None, local, MAC, general)? Local    Travis Mercado MLestine Mount8/24/2021, 10:59 AM  _________________________________________________________________   (provider comments below)

## 2020-01-18 NOTE — Telephone Encounter (Signed)
   Primary Cardiologist: Dr Curt Bears  Chart reviewed and patient contacted by phone today as part of pre-operative protocol coverage. Given past medical history and time since last visit, based on ACC/AHA guidelines, Ran Tullis would be at acceptable risk for the planned procedure without further cardiovascular testing.   Okay to hold aspirin 72 hours preop and resume as soon as safe postop  I will route this recommendation to the requesting party via Epic fax function and remove from pre-op pool.  Please call with questions.  Kerin Ransom, PA-C 01/18/2020, 2:09 PM

## 2020-01-19 NOTE — Telephone Encounter (Signed)
That form needs to be located then and sent to the EP/ Pacer clinic for them to review and fill out.  Kerin Ransom PA-C 01/19/2020 10:16 AM

## 2020-01-19 NOTE — Telephone Encounter (Signed)
Will complete form after f/u with Dr. Curt Bears on 01/27/20. Mickel Baas at [facility] made aware.

## 2020-01-19 NOTE — Telephone Encounter (Signed)
I will forward to device clinic fir further follow up.

## 2020-01-19 NOTE — Telephone Encounter (Signed)
    Not onsite,, forwarding to [facility] pool

## 2020-01-19 NOTE — Telephone Encounter (Signed)
   Connie from [employer], she confirmed receiving clearance, however, she said she faxed a form to fill up by Dr. Curt Bears clearing pt for his pacemaker. She said she needs to get the form filled up and signed by Dr. Curt Bears.

## 2020-01-27 ENCOUNTER — Other Ambulatory Visit: Payer: Self-pay

## 2020-01-27 ENCOUNTER — Encounter: Payer: Self-pay | Admitting: Cardiology

## 2020-01-27 ENCOUNTER — Ambulatory Visit (INDEPENDENT_AMBULATORY_CARE_PROVIDER_SITE_OTHER): Payer: Medicare Other | Admitting: Cardiology

## 2020-01-27 VITALS — BP 118/70 | HR 72 | Ht 66.0 in | Wt 212.4 lb

## 2020-01-27 DIAGNOSIS — I442 Atrioventricular block, complete: Secondary | ICD-10-CM

## 2020-01-27 LAB — CUP PACEART INCLINIC DEVICE CHECK
Battery Remaining Longevity: 55 mo
Battery Voltage: 2.99 V
Brady Statistic AP VP Percent: 83.75 %
Brady Statistic AP VS Percent: 0 %
Brady Statistic AS VP Percent: 16.25 %
Brady Statistic AS VS Percent: 0 %
Brady Statistic RA Percent Paced: 83.68 %
Brady Statistic RV Percent Paced: 99.95 %
Date Time Interrogation Session: 20210902171505
Implantable Lead Implant Date: 20170517
Implantable Lead Implant Date: 20170517
Implantable Lead Location: 753859
Implantable Lead Location: 753860
Implantable Lead Model: 5076
Implantable Lead Model: 5076
Implantable Pulse Generator Implant Date: 20170517
Lead Channel Impedance Value: 361 Ohm
Lead Channel Impedance Value: 399 Ohm
Lead Channel Impedance Value: 399 Ohm
Lead Channel Impedance Value: 475 Ohm
Lead Channel Pacing Threshold Amplitude: 0.625 V
Lead Channel Pacing Threshold Amplitude: 0.75 V
Lead Channel Pacing Threshold Pulse Width: 0.4 ms
Lead Channel Pacing Threshold Pulse Width: 0.4 ms
Lead Channel Sensing Intrinsic Amplitude: 2.375 mV
Lead Channel Sensing Intrinsic Amplitude: 23.75 mV
Lead Channel Setting Pacing Amplitude: 1.5 V
Lead Channel Setting Pacing Amplitude: 2.5 V
Lead Channel Setting Pacing Pulse Width: 0.4 ms
Lead Channel Setting Sensing Sensitivity: 2.8 mV

## 2020-01-27 NOTE — Progress Notes (Signed)
Electrophysiology Office Note   Date:  01/27/2020   ID:  Travis Mercado, Travis Mercado 18-Jan-1936, MRN [id number]  PCP:  Deland Pretty, MD  Primary Electrophysiologist:  Constance Haw, MD    No chief complaint on file.    History of Present Illness: Travis Mercado is a 84 y.o. male who presents today for electrophysiology evaluation.   History of HTN, HLD, CKD, DM and complete AV block.  Had pacemaker placed 10/11/15.    Today, denies symptoms of palpitations, chest pain, shortness of breath, orthopnea, PND, lower extremity edema, claudication, dizziness, presyncope, syncope, bleeding, or neurologic sequela. The patient is tolerating medications without difficulties.  He has done well.  He has no chest pain or shortness of breath.  He is able do all of his daily activities.  He has been traveling this summer, not having any issues.  He is unaware of his pacemaker.  Past Medical History:  Diagnosis Date  . DM2 (diabetes mellitus, type 2) ([place])   . GERD (gastroesophageal reflux disease)   . Heart murmur   . 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 HSV infection   . HTN (hypertension)   . Hyperlipidemia 06/05/2011  . Third degree heart block ([place]) 09/2015   Past Surgical History:  Procedure Laterality Date  . APPENDECTOMY    . EP IMPLANTABLE DEVICE N/A 10/11/2015   Procedure: Pacemaker Implant;  Surgeon: Montey Ebel Meredith Leeds, MD;  Location: [facility];  Service: Cardiovascular;  Laterality: N/A;     Current Outpatient Medications  Medication Sig Dispense Refill  . amlodipine-olmesartan (AZOR) 10-20 MG tablet Take 1 tablet by mouth daily.    Marland Kitchen aspirin EC 81 MG EC tablet Take 1 tablet (81 mg total) by mouth daily.    . cholecalciferol (VITAMIN D) 1000 UNITS tablet Take 1,000 Units by mouth daily.    Marland Kitchen ezetimibe-simvastatin (VYTORIN) 10-40 MG per tablet Take 1 tablet by mouth at bedtime.    . hydrOXYzine (ATARAX/VISTARIL) 25 MG tablet Take 25 mg  by mouth 3 (three) times daily as needed.    . metFORMIN (GLUCOPHAGE) 500 MG tablet Take 500 mg by mouth 2 (two) times daily.    . metoprolol tartrate (LOPRESSOR) 50 MG tablet Take 1 tablet (50 mg total) by mouth 2 (two) times daily. Pt needs to make appt for further refills - 1st attempt 180 tablet 0  . ondansetron (ZOFRAN) 4 MG tablet Take 1 tablet (4 mg total) by mouth every 6 (six) hours. 12 tablet 0  . oxyCODONE-acetaminophen (PERCOCET/ROXICET) 5-325 MG tablet Take 1 tablet by mouth every 4 (four) hours as needed for severe pain. 15 tablet 0  . pantoprazole (PROTONIX) 40 MG tablet Take 40 mg by mouth daily.    . simvastatin (ZOCOR) 40 MG tablet Take 40 mg by mouth daily.     No current facility-administered medications for this visit.    Allergies:   Patient has no known allergies.   Social History:  The patient  reports that he quit smoking about 36 years ago. His smoking use included cigarettes. He has never used smokeless tobacco. He reports that he does not drink alcohol and does not use drugs.   Family History:  The patient's family history includes Alzheimer's disease in his father; Cancer in his mother; Diabetes in his father and mother; Heart disease in his father; Hypertension in his brother; Prostate cancer in his brother and father.    ROS:  Please see the history of present illness.   Otherwise, review of systems is positive for none.   All other systems are reviewed and negative.   PHYSICAL EXAM: VS:  BP 118/70   Pulse 72   Ht 5\' 6"  (1.676 m)   Wt 212 lb 6.4 oz (96.3 kg)   SpO2 97%   BMI 34.28 kg/m  , BMI Body mass index is 34.28 kg/m. GEN: Well nourished, well developed, in no acute distress  HEENT: normal  Neck: no JVD, carotid bruits, or masses Cardiac: RRR; no murmurs, rubs, or gallops,no edema  Respiratory:  clear to auscultation bilaterally, normal work of breathing GI: soft, nontender, nondistended, + BS MS: no deformity or atrophy  Skin: warm and dry,  device site well healed Neuro:  Strength and sensation are intact Psych: euthymic mood, full affect  EKG:  EKG is ordered today. Personal review of the ekg ordered shows AV paced  Personal review of the device interrogation today. Results in [place]: 01/12/2020: BUN 21; Creatinine, Ser 1.84; Hemoglobin 13.5; Platelets 175; Potassium 4.0; Sodium 135    Lipid Panel     Component Value Date/Time   CHOL 132 10/11/2015 0525   TRIG 80 10/11/2015 0525   HDL 57 10/11/2015 0525   CHOLHDL 2.3 10/11/2015 0525   VLDL 16 10/11/2015 0525   LDLCALC 59 10/11/2015 0525     Wt Readings from Last 3 Encounters:  01/27/20 212 lb 6.4 oz (96.3 kg)  01/12/20 215 lb (97.5 kg)  01/19/19 216 lb (98 kg)      Other studies Reviewed: Additional studies/ records that were reviewed today include: TTE 10/11/15  Review of the above records today demonstrates:  - Left ventricle: The cavity size was normal. Wall thickness was   normal. Systolic function was normal. The estimated ejection   fraction was in the range of 60% to 65%. Wall motion was normal;   there were no regional wall motion abnormalities. Doppler   parameters are consistent with abnormal left ventricular   relaxation (grade 1 diastolic dysfunction). - Aortic valve: Trileaflet; moderately thickened, moderately   calcified leaflets. - Mitral valve: There was trivial regurgitation. - Tricuspid valve: There was mild regurgitation.   ASSESSMENT AND PLAN:  1.  Complete heart block: Status post Medtronic dual-chamber pacemaker implanted 10/11/2015.  Device functioning appropriately.  No changes at this time.    2.  Hypertension: Currently well controlled  3.  Hyperlipidemia: Continue Vytorin per primary physician  Current medicines are reviewed at length with the patient today.   The patient does not have concerns regarding his medicines.  The following changes were made today: None  Labs/ tests ordered today include:    Orders Placed This Encounter  Procedures  . EKG 12-Lead     Disposition:   FU with Avyanna Spada 12 months  Signed, Lavone Weisel Meredith Leeds, MD  01/27/2020 4:20 PM     [place] [address] [phone] (office) [phone] (fax)

## 2020-02-03 ENCOUNTER — Encounter (HOSPITAL_BASED_OUTPATIENT_CLINIC_OR_DEPARTMENT_OTHER): Payer: Self-pay | Admitting: Urology

## 2020-02-03 ENCOUNTER — Other Ambulatory Visit (HOSPITAL_COMMUNITY)
Admission: RE | Admit: 2020-02-03 | Discharge: 2020-02-03 | Disposition: A | Discharge status: Home / Self Care | Payer: Medicare Other | Source: Ambulatory Visit | Attending: Urology | Admitting: Urology

## 2020-02-03 DIAGNOSIS — Z20822 Contact with and (suspected) exposure to covid-19: Secondary | ICD-10-CM | POA: Diagnosis not present

## 2020-02-03 DIAGNOSIS — Z01812 Encounter for preprocedural laboratory examination: Secondary | ICD-10-CM | POA: Diagnosis not present

## 2020-02-03 LAB — SARS CORONAVIRUS 2 (TAT 6-24 HRS): SARS Coronavirus 2: NEGATIVE

## 2020-02-03 NOTE — Progress Notes (Signed)
Patient to arrive at 0600 on 02/07/2020. History and medications reviewed. Patient has implanted pacemaker. Clearance form on shadow chart. All pre-procedure instructions given. NPO after MN. Instructed to take BP medications with sip of water in AM. Will hold metformin morning dose. Stopped ASA today. Driver secured. Patient arranging friend/family for after procedure home care. Was not aware this was required.

## 2020-02-04 NOTE — H&P (Signed)
Office Visit Report     01/17/2020   --------------------------------------------------------------------------------   Travis Mercado  MRN: [id number]  DOB: Apr 10, 1936, 84 year old Male  SSN: [id number]   PRIMARY CARE:  W Gareth Eagle, MD  REFERRING:  Deland Pretty, MD  PROVIDER:  Ellison Hughs, M.D.  LOCATION:  [facility] [phone]     --------------------------------------------------------------------------------   CC: I have pain in the flank.  HPI: Travis Mercado is a 84 year-old male patient who was referred by Dr. Deland Pretty, MD who is here for flank pain.  The problem is on the right side. His pain started about 01/11/2020. The pain is sharp. The pain is intermittent. The pain does not radiate.   Resting< makes the pain better. Walking makes the pain worse. He was treated with the following pain medication(s): Ibuprofen.   He has not had this same pain previously. He has not had kidney stones.   -CTSS showed a 5 mm right mid ureteral stone associated with moderate hydronephrosis and a partially duplicated collecting system  -No prior history of kidney of kidney stones.  -Hx of CKD III      ALLERGIES: No Allergies    MEDICATIONS: Metformin Hcl  Metoprolol Tartrate  Simvastatin 20 mg tablet  Aspir 81 81 mg tablet, delayed release  Ezetimibe 10 mg tablet  Hydroxyzine Hcl 25 mg tablet  Olmesartan-Amlodipine-Hctz  Pantoprazole Sodium 40 mg tablet, delayed release  Sildenafil Citrate 20 mg tablet 2-5 PO PRN  Vitamin D3 25 mcg (1,000 unit) tablet,chewable     GU PSH: None   NON-GU PSH: Appendectomy. Heart Pacemaker, Insert - about 2017     GU PMH: Chronic kidney disease stage 3 (GFR 30-60) (Stable, Chronic) - 2017 Elevated PSA (Worsening, Chronic) - 2017 Renal calculus (Stable, Chronic), Right - 2017    NON-GU PMH: Cardiac murmur, unspecified Diabetes Type 2 GERD Hypercholesterolemia Hypertension    FAMILY HISTORY: Prostate Cancer  - Father   SOCIAL HISTORY: Marital Status: Widowed Preferred Language: English; Ethnicity: Not Hispanic Or Latino; Race: Black or African American Current Smoking Status: Patient does not smoke anymore. Has not smoked since 04/26/1976. Smoked for 5 years.  Has never drank.  Drinks 1 caffeinated drink per day.    REVIEW OF SYSTEMS:    GU Review Male:   Patient reports erection problems. Patient denies frequent urination, hard to postpone urination, burning/ pain with urination, get up at night to urinate, leakage of urine, stream starts and stops, trouble starting your stream, have to strain to urinate , and penile pain.  Gastrointestinal (Upper):   Patient reports nausea and vomiting. Patient denies indigestion/ heartburn.  Gastrointestinal (Lower):   Patient denies diarrhea and constipation.  Constitutional:   Patient denies fatigue, fever, weight loss, and night sweats.  Skin:   Patient denies skin rash/ lesion and itching.  Eyes:   Patient denies blurred vision and double vision.  Ears/ Nose/ Throat:   Patient denies sore throat and sinus problems.  Hematologic/Lymphatic:   Patient denies swollen glands and easy bruising.  Cardiovascular:   Patient denies leg swelling and chest pains.  Respiratory:   Patient denies cough and shortness of breath.  Endocrine:   Patient denies excessive thirst.  Musculoskeletal:   Patient denies back pain and joint pain.  Neurological:   Patient denies headaches and dizziness.  Psychologic:   Patient denies depression and anxiety.   VITAL SIGNS:      01/17/2020 10:16 AM  Weight 215 lb / 97.52  kg  Height 66 in / 167.64 cm  BP 117/81 mmHg  Heart Rate 99 /min  Temperature 97.8 F / 36.5 C  BMI 34.7 kg/m   GU PHYSICAL EXAMINATION:    Anus and Perineum: No hemorrhoids. No anal stenosis. No rectal fissure, no anal fissure. No edema, no dimple, no perineal tenderness, no anal tenderness.  Scrotum: No lesions. No edema. No cysts. No warts.  Epididymides:  Right: no spermatocele, no masses, no cysts, no tenderness, no induration, no enlargement. Left: no spermatocele, no masses, no cysts, no tenderness, no induration, no enlargement.  Testes: No tenderness, no swelling, no enlargement left testes. No tenderness, no swelling, no enlargement right testes. Normal location left testes. Normal location right testes. No mass, no cyst, no varicocele, no hydrocele left testes. No mass, no cyst, no varicocele, no hydrocele right testes.  Urethral Meatus: Normal size. No lesion, no wart, no discharge, no polyp. Normal location.  Penis: Circumcised, no warts, no cracks. No dorsal Peyronie's plaques, no left corporal Peyronie's plaques, no right corporal Peyronie's plaques, no scarring, no warts. No balanitis, no meatal stenosis.  Prostate: 40 gram or 2+ size. Left lobe normal consistency, right lobe normal consistency. Symmetrical lobes. No prostate nodule. Left lobe no tenderness, right lobe no tenderness.  Seminal Vesicles: Nonpalpable.  Sphincter Tone: Normal sphincter. No rectal tenderness. No rectal mass.    MULTI-SYSTEM PHYSICAL EXAMINATION:    Constitutional: Well-nourished. No physical deformities. Normally developed. Good grooming.  Neck: Neck symmetrical, not swollen. Normal tracheal position.  Respiratory: No labored breathing, no use of accessory muscles.   Cardiovascular: Normal temperature, normal extremity pulses, no swelling, no varicosities.  Lymphatic: No enlargement of neck, axillae, groin.  Skin: No paleness, no jaundice, no cyanosis. No lesion, no ulcer, no rash.  Neurologic / Psychiatric: Oriented to time, oriented to place, oriented to person. No depression, no anxiety, no agitation.  Gastrointestinal: No mass, no tenderness, no rigidity, non obese abdomen.  Eyes: Normal conjunctivae. Normal eyelids.  Ears, Nose, Mouth, and Throat: Left ear no scars, no lesions, no masses. Right ear no scars, no lesions, no masses. Nose no scars, no  lesions, no masses. Normal hearing. Normal lips.  Musculoskeletal: Normal gait and station of head and neck.     Complexity of Data:  Lab Test Review:   BMP, CBC with Diff  X-Ray Review: C.T. Abdomen/Pelvis: Reviewed Films. Reviewed Report. Discussed With Patient.     09/03/16  PSA  Total PSA 3.69 ng/dl   Notes:                     CLINICAL DATA: Flank pain, stone disease suspected     EXAM:  CT ABDOMEN AND PELVIS WITHOUT CONTRAST     TECHNIQUE:  Multidetector CT imaging of the abdomen and pelvis was performed  following the standard protocol without IV contrast.     COMPARISON: CT 10/16/2011     FINDINGS:  Lower chest: Atelectatic changes are present in the otherwise clear  lung bases. Normal cardiac size. Cardiac pacer leads are positioned  at the right atrium and cardiac apex within the right ventricle. No  pericardial effusion.     Hepatobiliary: No visible concerning liver lesion on this unenhanced  CT. Normal hepatic attenuation. Smooth liver surface contour. Few  prominent fold in the gallbladder without pericholecystic  inflammation or fluid. No visible calcified gallstones. No biliary  ductal dilatation.     Pancreas: Unremarkable. No pancreatic ductal dilatation or  surrounding inflammatory changes.  Spleen: Normal in size. No concerning splenic lesions.     Adrenals/Urinary Tract: Normal adrenal glands. There is bilateral  perinephric stranding, while nonspecific and the left given  similarity to comparison imaging as remote is 6761, this is  certainly increased on the right with some associated moderate right  hydroureteronephrosis of both upper and lower pole moieties with a  bifid ureter which appear to conjoin in the mid ureter just proximal  to a 5 mm calculus (2/63). No additional distal ureteral calculi. No  obstructive left urolithiasis or urinary tract dilatation. Some  chronic mild intramural fat within the anterior urinary bladder.   Circumferential bladder wall thickening is nonspecific given some  mild underdistention.     Stomach/Bowel: Small sliding-type hiatal hernia. Intramural fat  within the stomach, may reflect sequela of chronic gastritis. No  acute gastric inflammation. Duodenum is unremarkable. No small bowel  thickening or dilatation. Appendix is not visualized. No focal  inflammation the vicinity of the cecum to suggest an occult  appendicitis. No colonic dilatation or wall thickening. Few  noninflamed colonic diverticula. No evidence of obstruction.     Vascular/Lymphatic: Atherosclerotic calcifications within the  abdominal aorta and branch vessels. No aneurysm or ectasia. No  enlarged abdominopelvic lymph nodes.     Reproductive: Mild prostatomegaly. Seminal vesicles are  unremarkable. No concerning focal lesions. Vascular calcifications  about the ductus deferens quite typical for patients with history of  diabetes.     Other: Asymmetrically increased right perinephric stranding. No free  fluid or air in the abdomen or pelvis. No bowel containing hernias.  Mild body wall edema.     Musculoskeletal: Multilevel degenerative changes are present in the  imaged portions of the spine. No acute osseous abnormality or  suspicious osseous lesion. Partial fusion across the T10-T11  vertebral bodies, similar to comparison. Additional degenerative  changes in the hips and pelvis.     IMPRESSION:  1. Obstructing 5 mm calculus in the mid right ureter with some  associated moderate right hydroureteronephrosis of both upper and  lower pole moieties with incidentally noted bifid ureter more  proximally.  2. Circumferential bladder wall thickening is nonspecific given some  mild underdistention and prostatomegaly. Correlate with urinalysis  to exclude a superimposed cystitis and ascending tract infection.  3. Intramural fat within the stomach, may reflect sequela of chronic  gastritis.  4. Colonic  diverticulosis without evidence of acute diverticulitis.  5. Aortic Atherosclerosis (ICD10-I70.0).        Electronically Signed  By: Lovena Le M.D.  On: 01/12/2020 06:36   PROCEDURES:         KUB - [id number]  A single view of the abdomen is obtained.      . Patient confirmed No Neulasta OnPro Device.   There is a 5 mm calcification along the expected course of the right distal ureter along the sacral border. No urinary calculi seen within the upper urinary tract, on the left. No calcifications noted within bladder. No boney abnormalities. No abnormal bowel/gas patterns or signs of free air.    ASSESSMENT:      ICD-10 Details  1 GU:   Flank Pain - P50.93 Acute, Uncomplicated  2   Ureteral calculus - O67.1 Acute, Uncomplicated   PLAN:            Medications New Meds: Hydrocodone-Acetaminophen 5 mg-325 mg tablet 1 tablet PO Q 4 H PRN   #12  0 Refill(s)  Orders Labs BUN/Creatinine  X-Rays: KUB          Schedule Return Visit/Planned Activity: Return PRN          Document Letter(s):  Created for Patient: Clinical Summary   Created for Deland Pretty, MD         Notes:   The risks, benefits and alternatives of RIGHT ESWL was discussed with the patient. I described the risks which include arrhythmia, kidney contusion, kidney hemorrhage, need for transfusion, back discomfort, flank ecchymosis, flank abrasion, inability to break up stone, inability to pass stone fragments, Steinstrasse, infection associated with obstructing stones, need for different surgical procedure and possible need for repeat shockwave lithotripsy. The patient voices understanding and wishes to proceed.     * Signed by Ellison Hughs, M.D. on 01/17/20 at 3:51 PM (EDT)*     The information contained in this medical record document is considered private and confidential patient information. This information can only be used for the medical diagnosis and/or medical services that are being provided  by the patient's selected caregivers. This information can only be distributed outside of the patient's care if the patient agrees and signs waivers of authorization for this information to be sent to an outside source or route.

## 2020-02-07 ENCOUNTER — Encounter (HOSPITAL_BASED_OUTPATIENT_CLINIC_OR_DEPARTMENT_OTHER): Payer: Self-pay | Admitting: Urology

## 2020-02-07 ENCOUNTER — Other Ambulatory Visit: Payer: Self-pay

## 2020-02-07 ENCOUNTER — Ambulatory Visit (HOSPITAL_COMMUNITY): Payer: Medicare Other

## 2020-02-07 ENCOUNTER — Ambulatory Visit (HOSPITAL_BASED_OUTPATIENT_CLINIC_OR_DEPARTMENT_OTHER)
Admission: RE | Admit: 2020-02-07 | Discharge: 2020-02-07 | Disposition: A | Discharge status: Home / Self Care | Payer: Medicare Other | Attending: Urology | Admitting: Urology

## 2020-02-07 ENCOUNTER — Encounter (HOSPITAL_BASED_OUTPATIENT_CLINIC_OR_DEPARTMENT_OTHER)
Admission: RE | Disposition: A | Discharge status: Home / Self Care | Payer: Self-pay | Source: Home / Self Care | Attending: Urology

## 2020-02-07 DIAGNOSIS — E7800 Pure hypercholesterolemia, unspecified: Secondary | ICD-10-CM | POA: Insufficient documentation

## 2020-02-07 DIAGNOSIS — Z7982 Long term (current) use of aspirin: Secondary | ICD-10-CM | POA: Diagnosis not present

## 2020-02-07 DIAGNOSIS — N201 Calculus of ureter: Secondary | ICD-10-CM | POA: Diagnosis not present

## 2020-02-07 DIAGNOSIS — Z7984 Long term (current) use of oral hypoglycemic drugs: Secondary | ICD-10-CM | POA: Insufficient documentation

## 2020-02-07 DIAGNOSIS — Z950 Presence of cardiac pacemaker: Secondary | ICD-10-CM | POA: Diagnosis not present

## 2020-02-07 DIAGNOSIS — Z79899 Other long term (current) drug therapy: Secondary | ICD-10-CM | POA: Diagnosis not present

## 2020-02-07 DIAGNOSIS — Z87891 Personal history of nicotine dependence: Secondary | ICD-10-CM | POA: Insufficient documentation

## 2020-02-07 DIAGNOSIS — I129 Hypertensive chronic kidney disease with stage 1 through stage 4 chronic kidney disease, or unspecified chronic kidney disease: Secondary | ICD-10-CM | POA: Diagnosis not present

## 2020-02-07 DIAGNOSIS — E1122 Type 2 diabetes mellitus with diabetic chronic kidney disease: Secondary | ICD-10-CM | POA: Diagnosis not present

## 2020-02-07 DIAGNOSIS — N132 Hydronephrosis with renal and ureteral calculous obstruction: Secondary | ICD-10-CM | POA: Diagnosis not present

## 2020-02-07 DIAGNOSIS — N183 Chronic kidney disease, stage 3 unspecified: Secondary | ICD-10-CM | POA: Insufficient documentation

## 2020-02-07 HISTORY — PX: EXTRACORPOREAL SHOCK WAVE LITHOTRIPSY: SHX1557

## 2020-02-07 HISTORY — DX: Presence of cardiac pacemaker: Z95.0

## 2020-02-07 LAB — GLUCOSE, CAPILLARY: Glucose-Capillary: 121 mg/dL — ABNORMAL HIGH (ref 70–99)

## 2020-02-07 SURGERY — LITHOTRIPSY, ESWL
Anesthesia: LOCAL | Laterality: Right

## 2020-02-07 MED ORDER — TAMSULOSIN HCL 0.4 MG PO CAPS: 0.4000 mg | ORAL_CAPSULE | Freq: Every day | ORAL | 0 refills | Status: DC

## 2020-02-07 MED ORDER — DIPHENHYDRAMINE HCL 25 MG PO CAPS
ORAL_CAPSULE | ORAL | Status: AC
  Filled 2020-02-07: qty 1

## 2020-02-07 MED ORDER — DIPHENHYDRAMINE HCL 25 MG PO CAPS
25.0000 mg | ORAL_CAPSULE | ORAL | Status: AC
  Administered 2020-02-07: 25 mg via ORAL

## 2020-02-07 MED ORDER — DIAZEPAM 5 MG PO TABS
ORAL_TABLET | ORAL | Status: AC
  Filled 2020-02-07: qty 2

## 2020-02-07 MED ORDER — DIAZEPAM 5 MG PO TABS
10.0000 mg | ORAL_TABLET | ORAL | Status: AC
  Administered 2020-02-07: 10 mg via ORAL

## 2020-02-07 MED ORDER — CIPROFLOXACIN HCL 500 MG PO TABS
500.0000 mg | ORAL_TABLET | ORAL | Status: AC
  Administered 2020-02-07: 500 mg via ORAL

## 2020-02-07 MED ORDER — CIPROFLOXACIN HCL 500 MG PO TABS
ORAL_TABLET | ORAL | Status: AC
  Filled 2020-02-07: qty 1

## 2020-02-07 MED ORDER — SODIUM CHLORIDE 0.9 % IV SOLN: INTRAVENOUS | Status: DC

## 2020-02-07 NOTE — Op Note (Addendum)
Right mid-distal stone 5 mm  Right ESWL   Findings: Stone faded but did not disappear. He may need a staged procedure if he fails to pass the the stone and or stone fragments.

## 2020-02-07 NOTE — Interval H&P Note (Signed)
History and Physical Interval Note:  02/07/2020 9:25 AM  Travis Mercado  has presented today for surgery, with the diagnosis of RIGHT URETERAL STONE.  The various methods of treatment have been discussed with the patient and family. After consideration of risks, benefits and other options for treatment, the patient has consented to  Procedure(s): EXTRACORPOREAL SHOCK WAVE LITHOTRIPSY (ESWL) (Right) as a surgical intervention.  The patient's history has been reviewed, patient examined, no change in status, stable for surgery.  I have reviewed the patient's chart and labs.  Stable stone right mid-distal ureter. Pt without fever or dysuria. No stone passage noted by patient. Questions were answered to the patient's satisfaction.     Festus Aloe

## 2020-02-07 NOTE — Interval H&P Note (Signed)
History and Physical Interval Note:  02/07/2020 7:45 AM  Travis Mercado  has presented today for surgery, with the diagnosis of RIGHT URETERAL STONE.  The various methods of treatment have been discussed with the patient and family. After consideration of risks, benefits and other options for treatment, the patient has consented to  Procedure(s): EXTRACORPOREAL SHOCK WAVE LITHOTRIPSY (ESWL) (Right) as a surgical intervention.  The patient's history has been reviewed, patient examined, no change in status, stable for surgery.  I have reviewed the patient's chart and labs. No fever or dysuria. No stone passage. Stone appear to be at right sacral border similar to the 8/23 KUB on my review- official read pending. Questions were answered to the patient's satisfaction.     Festus Aloe

## 2020-02-07 NOTE — Discharge Instructions (Signed)
Lithotripsy, Care After °This sheet gives you information about how to care for yourself after your procedure. Your health care provider may also give you more specific instructions. If you have problems or questions, contact your health care provider. °What can I expect after the procedure? °After the procedure, it is common to have: °· Some blood in your urine. This should only last for a few days. °· Soreness in your back, sides, or upper abdomen for a few days. °· Blotches or bruises on your back where the pressure wave entered the skin. °· Pain, discomfort, or nausea when pieces (fragments) of the kidney stone move through the tube that carries urine from the kidney to the bladder (ureter). Stone fragments may pass soon after the procedure, but they may continue to pass for up to 4-8 weeks. °? If you have severe pain or nausea, contact your health care provider. This may be caused by a large stone that was not broken up, and this may mean that you need more treatment. °· Some pain or discomfort during urination. °· Some pain or discomfort in the lower abdomen or (in men) at the base of the penis. °Follow these instructions at home: °Medicines °· Take over-the-counter and prescription medicines only as told by your health care provider. °· If you were prescribed an antibiotic medicine, take it as told by your health care provider. Do not stop taking the antibiotic even if you start to feel better. °· Do not drive for 24 hours if you were given a medicine to help you relax (sedative). °· Do not drive or use heavy machinery while taking prescription pain medicine. °Eating and drinking ° °  ° °· Drink enough water and fluids to keep your urine clear or pale yellow. This helps any remaining pieces of the stone to pass. It can also help prevent new stones from forming. °· Eat plenty of fresh fruits and vegetables. °· Follow instructions from your health care provider about eating and drinking restrictions. You may be  instructed: °? To reduce how much salt (sodium) you eat or drink. Check ingredients and nutrition facts on packaged foods and beverages. °? To reduce how much meat you eat. °· Eat the recommended amount of calcium for your age and gender. Ask your health care provider how much calcium you should have. °General instructions °· Get plenty of rest. °· Most people can resume normal activities 1-2 days after the procedure. Ask your health care provider what activities are safe for you. °· Your health care provider may direct you to lie in a certain position (postural drainage) and tap firmly (percuss) over your kidney area to help stone fragments pass. Follow instructions as told by your health care provider. °· If directed, strain all urine through the strainer that was provided by your health care provider. °? Keep all fragments for your health care provider to see. Any stones that are found may be sent to a medical lab for examination. The stone may be as small as a grain of salt. °· Keep all follow-up visits as told by your health care provider. This is important. °Contact a health care provider if: °· You have pain that is severe or does not get better with medicine. °· You have nausea that is severe or does not go away. °· You have blood in your urine longer than your health care provider told you to expect. °· You have more blood in your urine. °· You have pain during urination that does   not go away.  You urinate more frequently than usual and this does not go away.  You develop a rash or any other possible signs of an allergic reaction. Get help right away if:  You have severe pain in your back, sides, or upper abdomen.  You have severe pain while urinating.  Your urine is very dark red.  You have blood in your stool (feces).  You cannot pass any urine at all.  You feel a strong urge to urinate after emptying your bladder.  You have a fever or chills.  You develop shortness of breath,  difficulty breathing, or chest pain.  You have severe nausea that leads to persistent vomiting.  You faint. Summary  After this procedure, it is common to have some pain, discomfort, or nausea when pieces (fragments) of the kidney stone move through the tube that carries urine from the kidney to the bladder (ureter). If this pain or nausea is severe, however, you should contact your health care provider.  Most people can resume normal activities 1-2 days after the procedure. Ask your health care provider what activities are safe for you.  Drink enough water and fluids to keep your urine clear or pale yellow. This helps any remaining pieces of the stone to pass, and it can help prevent new stones from forming.  If directed, strain your urine and keep all fragments for your health care provider to see. Fragments or stones may be as small as a grain of salt.  Get help right away if you have severe pain in your back, sides, or upper abdomen or have severe pain while urinating. This information is not intended to replace advice given to you by your health care provider. Make sure you discuss any questions you have with your health care provider. Document Revised: 08/24/2018 Document Reviewed: 04/03/2016 Elsevier Patient Education  [place] Instructions  Activity: Get plenty of rest for the remainder of the day. A responsible individual must stay with you for 24 hours following the procedure.  For the next 24 hours, DO NOT: -Drive a car -Paediatric nurse -Drink alcoholic beverages -Take any medication unless instructed by your physician -Make any legal decisions or sign important papers.  Meals: Start with liquid foods such as gelatin or soup. Progress to regular foods as tolerated. Avoid greasy, spicy, heavy foods. If nausea and/or vomiting occur, drink only clear liquids until the nausea and/or vomiting subsides. Call your physician if vomiting  continues.

## 2020-02-07 NOTE — Interval H&P Note (Deleted)
History and Physical Interval Note:  02/07/2020 9:22 AM  [name]  has presented today for surgery, with the diagnosis of RIGHT URETERAL STONE.  The various methods of treatment have been discussed with the patient and family. After consideration of risks, benefits and other options for treatment, the patient has consented to  Procedure(s): EXTRACORPOREAL SHOCK WAVE LITHOTRIPSY (ESWL) (Right) as a surgical intervention.  The patient's history has been reviewed, patient examined, no change in status, stable for surgery.  I have reviewed the patient's chart and labs. 15 mm stone left renal pelvis stable. Pt without fever or dysuria. Needs pain med rx'd. Questions were answered to the patient's satisfaction.     Travis Mercado

## 2020-02-08 ENCOUNTER — Encounter (HOSPITAL_BASED_OUTPATIENT_CLINIC_OR_DEPARTMENT_OTHER): Payer: Self-pay | Admitting: Urology

## 2020-02-08 DIAGNOSIS — I10 Essential (primary) hypertension: Secondary | ICD-10-CM | POA: Diagnosis not present

## 2020-02-08 DIAGNOSIS — E1121 Type 2 diabetes mellitus with diabetic nephropathy: Secondary | ICD-10-CM | POA: Diagnosis not present

## 2020-02-08 DIAGNOSIS — E782 Mixed hyperlipidemia: Secondary | ICD-10-CM | POA: Diagnosis not present

## 2020-02-10 DIAGNOSIS — Z23 Encounter for immunization: Secondary | ICD-10-CM | POA: Diagnosis not present

## 2020-02-10 DIAGNOSIS — I10 Essential (primary) hypertension: Secondary | ICD-10-CM | POA: Diagnosis not present

## 2020-02-10 DIAGNOSIS — E782 Mixed hyperlipidemia: Secondary | ICD-10-CM | POA: Diagnosis not present

## 2020-02-10 DIAGNOSIS — E1121 Type 2 diabetes mellitus with diabetic nephropathy: Secondary | ICD-10-CM | POA: Diagnosis not present

## 2020-02-17 ENCOUNTER — Ambulatory Visit (INDEPENDENT_AMBULATORY_CARE_PROVIDER_SITE_OTHER): Payer: Medicare Other | Admitting: Emergency Medicine

## 2020-02-17 DIAGNOSIS — E1121 Type 2 diabetes mellitus with diabetic nephropathy: Secondary | ICD-10-CM | POA: Diagnosis not present

## 2020-02-17 DIAGNOSIS — I10 Essential (primary) hypertension: Secondary | ICD-10-CM | POA: Diagnosis not present

## 2020-02-17 DIAGNOSIS — I442 Atrioventricular block, complete: Secondary | ICD-10-CM | POA: Diagnosis not present

## 2020-02-17 DIAGNOSIS — E782 Mixed hyperlipidemia: Secondary | ICD-10-CM | POA: Diagnosis not present

## 2020-02-18 LAB — CUP PACEART REMOTE DEVICE CHECK
Battery Remaining Longevity: 52 mo
Battery Voltage: 2.99 V
Brady Statistic AP VP Percent: 72.87 %
Brady Statistic AP VS Percent: 0 %
Brady Statistic AS VP Percent: 27.13 %
Brady Statistic AS VS Percent: 0 %
Brady Statistic RA Percent Paced: 72.85 %
Brady Statistic RV Percent Paced: 100 %
Date Time Interrogation Session: 20210923125334
Implantable Lead Implant Date: 20170517
Implantable Lead Implant Date: 20170517
Implantable Lead Location: 753859
Implantable Lead Location: 753860
Implantable Lead Model: 5076
Implantable Lead Model: 5076
Implantable Pulse Generator Implant Date: 20170517
Lead Channel Impedance Value: 342 Ohm
Lead Channel Impedance Value: 361 Ohm
Lead Channel Impedance Value: 380 Ohm
Lead Channel Impedance Value: 437 Ohm
Lead Channel Pacing Threshold Amplitude: 0.5 V
Lead Channel Pacing Threshold Amplitude: 0.625 V
Lead Channel Pacing Threshold Pulse Width: 0.4 ms
Lead Channel Pacing Threshold Pulse Width: 0.4 ms
Lead Channel Sensing Intrinsic Amplitude: 2.25 mV
Lead Channel Sensing Intrinsic Amplitude: 2.25 mV
Lead Channel Sensing Intrinsic Amplitude: 23.75 mV
Lead Channel Sensing Intrinsic Amplitude: 23.75 mV
Lead Channel Setting Pacing Amplitude: 1.5 V
Lead Channel Setting Pacing Amplitude: 2.5 V
Lead Channel Setting Pacing Pulse Width: 0.4 ms
Lead Channel Setting Sensing Sensitivity: 2.8 mV

## 2020-02-22 NOTE — Progress Notes (Signed)
Remote pacemaker transmission.   

## 2020-03-16 DIAGNOSIS — N201 Calculus of ureter: Secondary | ICD-10-CM | POA: Diagnosis not present

## 2020-04-02 ENCOUNTER — Other Ambulatory Visit: Payer: Self-pay | Admitting: Cardiology

## 2020-04-06 DIAGNOSIS — I10 Essential (primary) hypertension: Secondary | ICD-10-CM | POA: Diagnosis not present

## 2020-04-13 DIAGNOSIS — N1832 Chronic kidney disease, stage 3b: Secondary | ICD-10-CM | POA: Diagnosis not present

## 2020-04-13 DIAGNOSIS — Z0001 Encounter for general adult medical examination with abnormal findings: Secondary | ICD-10-CM | POA: Diagnosis not present

## 2020-04-13 DIAGNOSIS — D649 Anemia, unspecified: Secondary | ICD-10-CM | POA: Diagnosis not present

## 2020-04-25 DIAGNOSIS — N201 Calculus of ureter: Secondary | ICD-10-CM | POA: Diagnosis not present

## 2020-04-27 DIAGNOSIS — H35033 Hypertensive retinopathy, bilateral: Secondary | ICD-10-CM | POA: Diagnosis not present

## 2020-04-27 DIAGNOSIS — E113211 Type 2 diabetes mellitus with mild nonproliferative diabetic retinopathy with macular edema, right eye: Secondary | ICD-10-CM | POA: Diagnosis not present

## 2020-05-04 ENCOUNTER — Telehealth: Payer: Self-pay | Admitting: Cardiology

## 2020-05-04 NOTE — Telephone Encounter (Signed)
LMOVM for pt to let him know that I reschedule his home remote to 06-02-2020. He can transmit any time that day.

## 2020-05-04 NOTE — Telephone Encounter (Signed)
     1. Has your device fired?   2. Is you device beeping?   3. Are you experiencing draining or swelling at device site?   4. Are you calling to see if we received your device transmission?   5. Have you passed out?   Pt will be going out of town on 12/23 and would like to r/s his transmission around 06/01/20 or 06/02/20   Please route to Device Clinic Pool .

## 2020-05-11 ENCOUNTER — Other Ambulatory Visit: Payer: Self-pay | Admitting: *Deleted

## 2020-05-11 DIAGNOSIS — I712 Thoracic aortic aneurysm, without rupture, unspecified: Secondary | ICD-10-CM

## 2020-06-02 ENCOUNTER — Ambulatory Visit (INDEPENDENT_AMBULATORY_CARE_PROVIDER_SITE_OTHER): Payer: Medicare Other

## 2020-06-02 DIAGNOSIS — I441 Atrioventricular block, second degree: Secondary | ICD-10-CM | POA: Diagnosis not present

## 2020-06-03 LAB — CUP PACEART REMOTE DEVICE CHECK
Battery Remaining Longevity: 52 mo
Battery Voltage: 2.99 V
Brady Statistic AP VP Percent: 68.33 %
Brady Statistic AP VS Percent: 0 %
Brady Statistic AS VP Percent: 31.66 %
Brady Statistic AS VS Percent: 0.01 %
Brady Statistic RA Percent Paced: 68.27 %
Brady Statistic RV Percent Paced: 99.95 %
Date Time Interrogation Session: 20220107161943
Implantable Lead Implant Date: 20170517
Implantable Lead Implant Date: 20170517
Implantable Lead Location: 753859
Implantable Lead Location: 753860
Implantable Lead Model: 5076
Implantable Lead Model: 5076
Implantable Pulse Generator Implant Date: 20170517
Lead Channel Impedance Value: 361 Ohm
Lead Channel Impedance Value: 361 Ohm
Lead Channel Impedance Value: 380 Ohm
Lead Channel Impedance Value: 437 Ohm
Lead Channel Pacing Threshold Amplitude: 0.5 V
Lead Channel Pacing Threshold Amplitude: 0.625 V
Lead Channel Pacing Threshold Pulse Width: 0.4 ms
Lead Channel Pacing Threshold Pulse Width: 0.4 ms
Lead Channel Sensing Intrinsic Amplitude: 14.875 mV
Lead Channel Sensing Intrinsic Amplitude: 14.875 mV
Lead Channel Sensing Intrinsic Amplitude: 2.375 mV
Lead Channel Sensing Intrinsic Amplitude: 2.375 mV
Lead Channel Setting Pacing Amplitude: 1.5 V
Lead Channel Setting Pacing Amplitude: 2.5 V
Lead Channel Setting Pacing Pulse Width: 0.4 ms
Lead Channel Setting Sensing Sensitivity: 2.8 mV

## 2020-06-05 DIAGNOSIS — Z03818 Encounter for observation for suspected exposure to other biological agents ruled out: Secondary | ICD-10-CM | POA: Diagnosis not present

## 2020-06-08 ENCOUNTER — Ambulatory Visit
Admission: RE | Admit: 2020-06-08 | Discharge: 2020-06-08 | Disposition: A | Discharge status: Home / Self Care | Payer: Medicare Other | Source: Ambulatory Visit | Attending: Cardiothoracic Surgery | Admitting: Cardiothoracic Surgery

## 2020-06-08 DIAGNOSIS — I700 Atherosclerosis of aorta: Secondary | ICD-10-CM | POA: Diagnosis not present

## 2020-06-08 DIAGNOSIS — I712 Thoracic aortic aneurysm, without rupture, unspecified: Secondary | ICD-10-CM

## 2020-06-08 DIAGNOSIS — I2510 Atherosclerotic heart disease of native coronary artery without angina pectoris: Secondary | ICD-10-CM | POA: Diagnosis not present

## 2020-06-08 IMAGING — CT CT ANGIO CHEST
2 of 6 series · 13 of 36 positions shown · IV contrast (iopamidol)
Comparison: [DATE]

CLINICAL DATA: Thoracic aortic aneurysm follow-up

EXAM:
CT ANGIOGRAPHY CHEST WITH CONTRAST
TECHNIQUE: Multidetector CT imaging of the chest was performed using the
standard protocol during bolus administration of intravenous
contrast. Multiplanar CT image reconstructions and MIPs were
obtained to evaluate the vascular anatomy.
CONTRAST:  60mL [O2] IOPAMIDOL ([O2]) INJECTION 76%

[Series 5: cta thorax 2.00 bv36 s3 axial arterial · axial · arterial · 0.67mm/px · z∈[+1399,+1696]mm · 12 of 177 slices shown]
[im 14/177  lung]
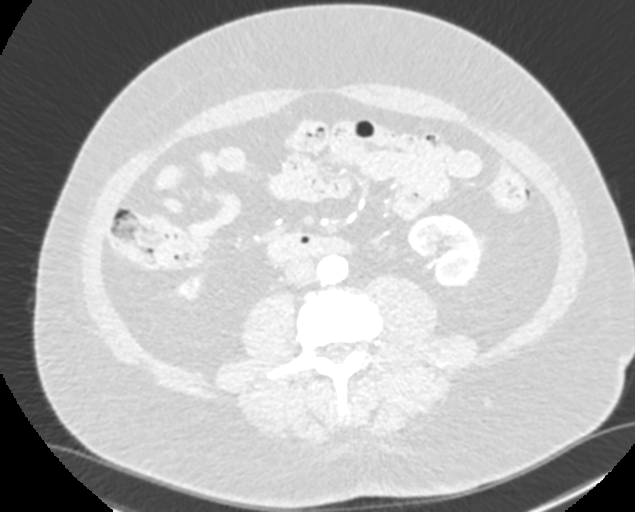
[im 28/177  mediastinal]
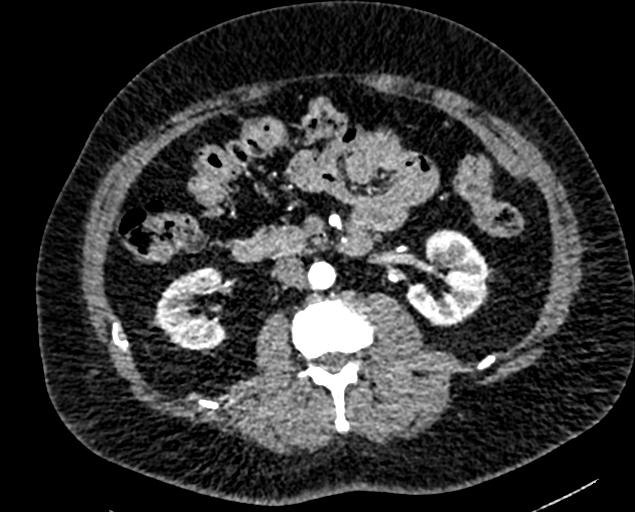
[im 41/177  lung]
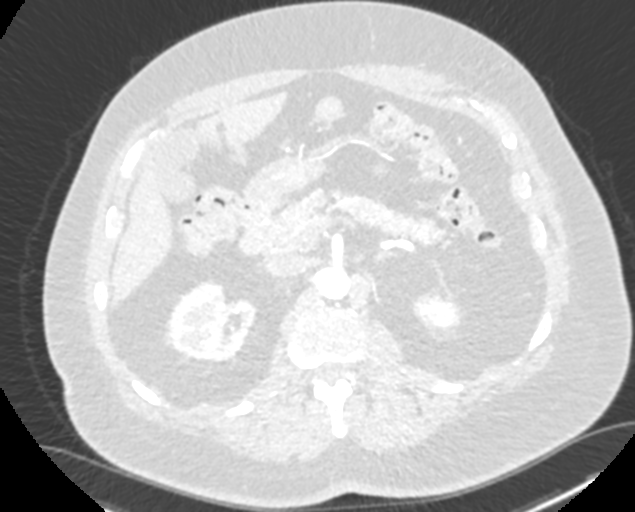
[im 55/177  mediastinal]
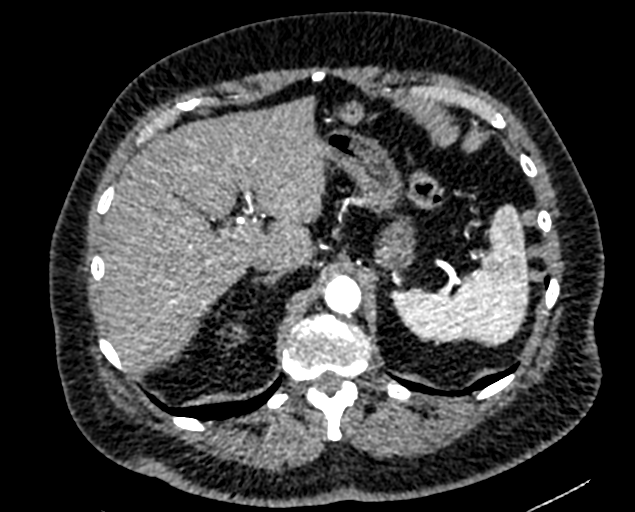
[im 68/177  lung]
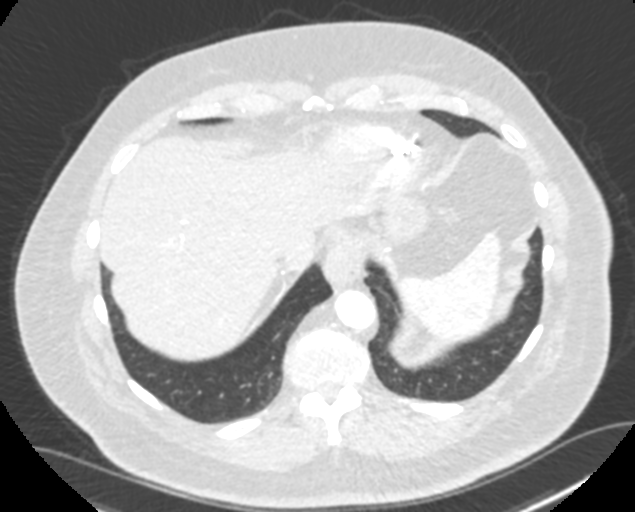
[im 82/177  mediastinal]
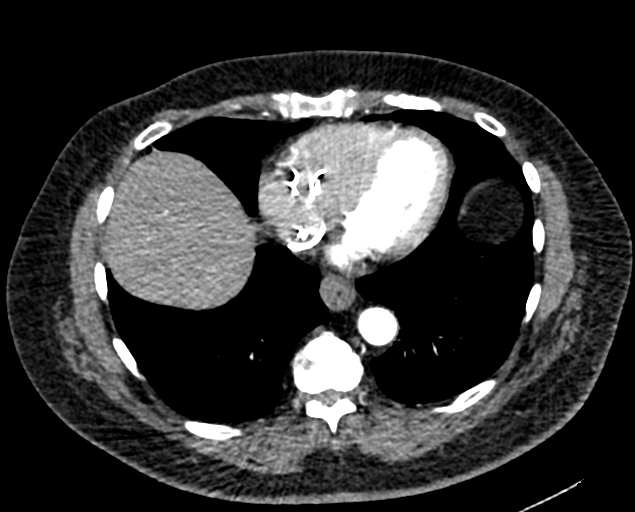
[im 95/177  lung]
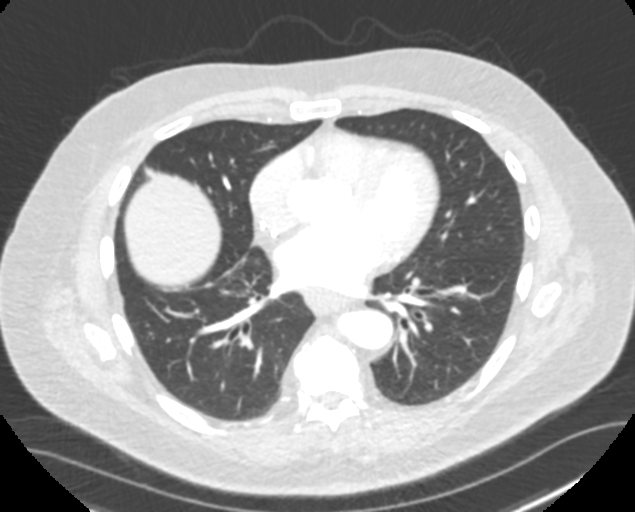
[im 109/177  mediastinal]
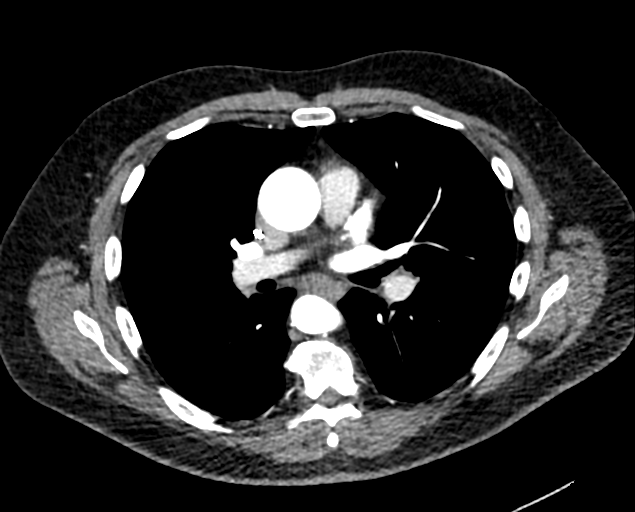
[im 122/177  lung]
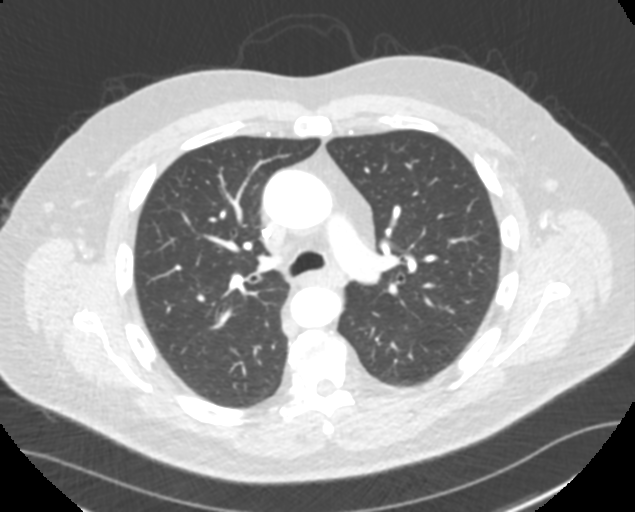
[im 136/177  mediastinal]
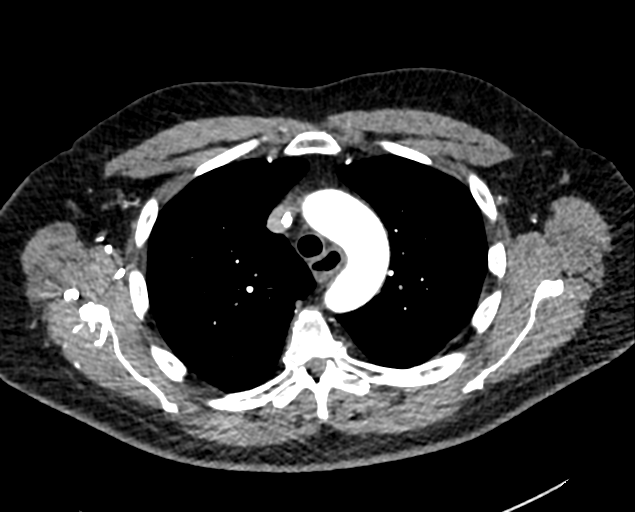
[im 149/177  lung]
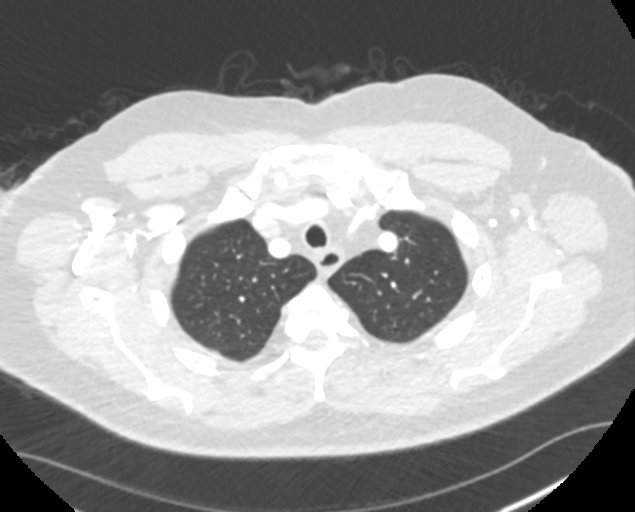
[im 163/177  mediastinal]
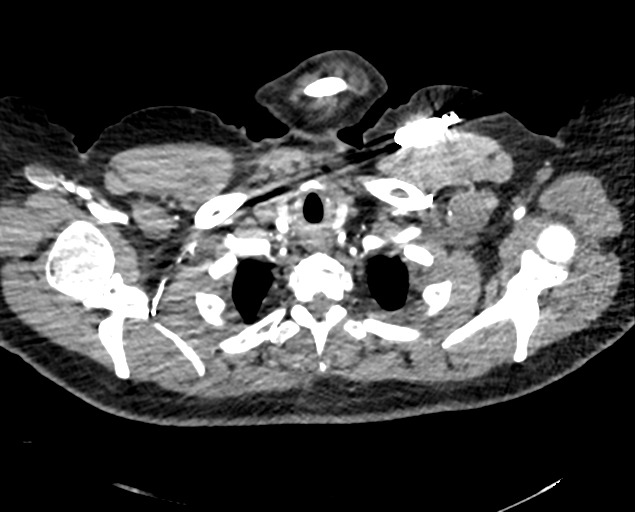

[Series 10: cta thorax 2.00 bv36 s3 cor st · coronal · 0.70mm/px · 1 of 169 slices shown]
[im 85/169  mediastinal]
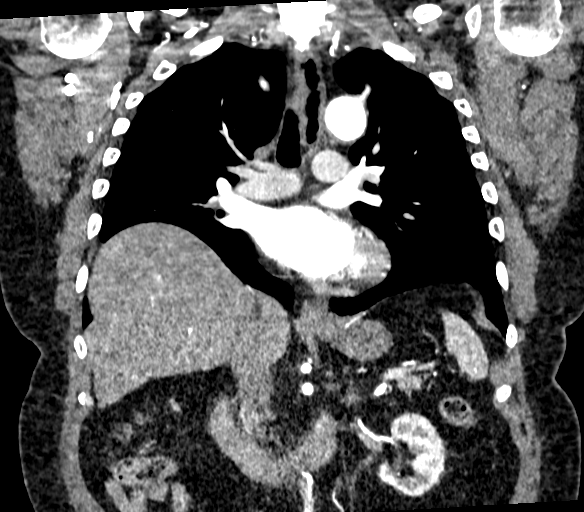

[13 of 36 positions shown; findings below may reference images not displayed]

FINDINGS: Cardiovascular: Normal heart size. No pericardial effusion.
Left-sided implanted cardiac device with leads terminating in the
right atrium and right ventricle. Pulmonary trunk is nondilated.
Thoracic aortic measurements as follows:

Aortic root: 3.2 cm (previously 3.3 cm)

Mid ascending aorta: 4.0 cm (previously 4.2 cm)

Proximal arch: 3.0 cm (previously 3.0 cm)

Distal arch: 2.6 cm (previously 2.7 cm)

Similar tortuosity of the descending aorta without ectasias or
aneurysm. Three vessel arch. Minimal atherosclerosis along the
undersurface of the aortic arch. Mild coronary artery calcification.

Mediastinum/Nodes: No enlarged mediastinal, hilar, or axillary lymph
nodes. Thyroid gland, trachea, and esophagus demonstrate no
significant findings.

Lungs/Pleura: Lungs are clear. No focal airspace consolidation,
pleural effusion, or pneumothorax.

Upper Abdomen: No acute abnormality.

Musculoskeletal: No chest wall abnormality. No acute or significant
osseous findings.

Review of the MIP images confirms the above findings.
IMPRESSION: 1. Mild ascending thoracic aortic aneurysm measuring up to 4.0 cm
(previously 4.2 cm).
2. Lungs are clear.
3. Aortic and coronary artery atherosclerosis ([O2]-[O2]).

## 2020-06-08 MED ORDER — IOPAMIDOL (ISOVUE-370) INJECTION 76%
60.0000 mL | Freq: Once | INTRAVENOUS | Status: AC | PRN
  Administered 2020-06-08: 60 mL via INTRAVENOUS

## 2020-06-12 ENCOUNTER — Ambulatory Visit: Payer: Medicare Other | Admitting: Cardiothoracic Surgery

## 2020-06-16 NOTE — Progress Notes (Signed)
Remote pacemaker transmission.   

## 2020-06-26 ENCOUNTER — Encounter: Payer: Self-pay | Admitting: Cardiothoracic Surgery

## 2020-06-26 ENCOUNTER — Ambulatory Visit (INDEPENDENT_AMBULATORY_CARE_PROVIDER_SITE_OTHER): Payer: Medicare Other | Admitting: Cardiothoracic Surgery

## 2020-06-26 ENCOUNTER — Other Ambulatory Visit: Payer: Self-pay

## 2020-06-26 VITALS — BP 105/72 | HR 83 | Temp 97.7°F | Resp 20 | Ht 66.0 in | Wt 208.0 lb

## 2020-06-26 DIAGNOSIS — I712 Thoracic aortic aneurysm, without rupture: Secondary | ICD-10-CM

## 2020-06-26 DIAGNOSIS — I7121 Aneurysm of the ascending aorta, without rupture: Secondary | ICD-10-CM

## 2020-07-14 NOTE — Progress Notes (Signed)
The patient returns for follow-up and ascending aortic aneurysm surveillance.  He has previously been seen by Dr. Prescott Gum for a 4.4 cm ascending aortic aneurysm.  This is an 52-month follow-up.  He has no symptoms of chest pain or back pain in the interim.  His blood pressure is well controlled on his current regimen.  Current Outpatient Medications  Medication Instructions  . amlodipine-olmesartan (AZOR) 10-20 MG tablet 1 tablet, Oral, Daily  . aspirin 81 mg, Oral, Daily  . cholecalciferol (VITAMIN D) 1,000 Units, Daily  . ezetimibe-simvastatin (VYTORIN) 10-40 MG per tablet 1 tablet, Daily at bedtime  . hydrOXYzine (ATARAX/VISTARIL) 25 mg, 3 times daily PRN  . metFORMIN (GLUCOPHAGE) 500 mg, Oral, 2 times daily  . metoprolol tartrate (LOPRESSOR) 50 mg, Oral, 2 times daily  . oxyCODONE-acetaminophen (PERCOCET/ROXICET) 5-325 MG tablet 1 tablet, Oral, Every 4 hours PRN  . pantoprazole (PROTONIX) 40 mg, Oral, Daily  . simvastatin (ZOCOR) 40 mg, Oral, Daily   Physical exam:  BP 105/72   Pulse 83   Temp 97.7 F (36.5 C) (Skin)   Resp 20   Ht 5\' 6"  (1.676 m)   Wt 94.3 kg   SpO2 98% Comment: RA  BMI 33.57 kg/m   Well-appearing man in no acute distress Clear to auscultation bilaterally Regular rate and rhythm Abdomen soft nontender Extremities: Warm and well-perfused  Imaging: I have personally reviewed his most recent CT scan from 06/08/2020 which demonstrates a stable 4.4 ascending aortic aneurysm.  Impression: Stable aortic dilation with low risk for emergency condition  Plan: Follow-up in 18 months with repeat CT scan Blood pressure control as he is doing Report chest pain or back pain to local emergency department in the interim  Gianfranco Araki Z. Orvan Seen, [place]

## 2020-08-15 DIAGNOSIS — I10 Essential (primary) hypertension: Secondary | ICD-10-CM | POA: Diagnosis not present

## 2020-08-15 DIAGNOSIS — E782 Mixed hyperlipidemia: Secondary | ICD-10-CM | POA: Diagnosis not present

## 2020-08-15 DIAGNOSIS — E1121 Type 2 diabetes mellitus with diabetic nephropathy: Secondary | ICD-10-CM | POA: Diagnosis not present

## 2020-08-16 DIAGNOSIS — I10 Essential (primary) hypertension: Secondary | ICD-10-CM | POA: Diagnosis not present

## 2020-08-16 DIAGNOSIS — E1121 Type 2 diabetes mellitus with diabetic nephropathy: Secondary | ICD-10-CM | POA: Diagnosis not present

## 2020-08-16 DIAGNOSIS — E782 Mixed hyperlipidemia: Secondary | ICD-10-CM | POA: Diagnosis not present

## 2020-09-01 ENCOUNTER — Ambulatory Visit (INDEPENDENT_AMBULATORY_CARE_PROVIDER_SITE_OTHER): Payer: Medicare Other

## 2020-09-01 DIAGNOSIS — I441 Atrioventricular block, second degree: Secondary | ICD-10-CM | POA: Diagnosis not present

## 2020-09-05 LAB — CUP PACEART REMOTE DEVICE CHECK
Battery Remaining Longevity: 46 mo
Battery Voltage: 2.98 V
Brady Statistic AP VP Percent: 73.51 %
Brady Statistic AP VS Percent: 0 %
Brady Statistic AS VP Percent: 26.49 %
Brady Statistic AS VS Percent: 0 %
Brady Statistic RA Percent Paced: 73.48 %
Brady Statistic RV Percent Paced: 99.99 %
Date Time Interrogation Session: 20220408141946
Implantable Lead Implant Date: 20170517
Implantable Lead Implant Date: 20170517
Implantable Lead Location: 753859
Implantable Lead Location: 753860
Implantable Lead Model: 5076
Implantable Lead Model: 5076
Implantable Pulse Generator Implant Date: 20170517
Lead Channel Impedance Value: 342 Ohm
Lead Channel Impedance Value: 361 Ohm
Lead Channel Impedance Value: 380 Ohm
Lead Channel Impedance Value: 418 Ohm
Lead Channel Pacing Threshold Amplitude: 0.5 V
Lead Channel Pacing Threshold Amplitude: 0.625 V
Lead Channel Pacing Threshold Pulse Width: 0.4 ms
Lead Channel Pacing Threshold Pulse Width: 0.4 ms
Lead Channel Sensing Intrinsic Amplitude: 18.875 mV
Lead Channel Sensing Intrinsic Amplitude: 18.875 mV
Lead Channel Sensing Intrinsic Amplitude: 2.5 mV
Lead Channel Sensing Intrinsic Amplitude: 2.5 mV
Lead Channel Setting Pacing Amplitude: 1.5 V
Lead Channel Setting Pacing Amplitude: 2.5 V
Lead Channel Setting Pacing Pulse Width: 0.4 ms
Lead Channel Setting Sensing Sensitivity: 2.8 mV

## 2020-09-15 NOTE — Progress Notes (Signed)
Remote pacemaker transmission.   

## 2020-12-01 ENCOUNTER — Ambulatory Visit (INDEPENDENT_AMBULATORY_CARE_PROVIDER_SITE_OTHER): Payer: Medicare Other

## 2020-12-01 DIAGNOSIS — I441 Atrioventricular block, second degree: Secondary | ICD-10-CM

## 2020-12-03 LAB — CUP PACEART REMOTE DEVICE CHECK
Battery Remaining Longevity: 41 mo
Battery Voltage: 2.98 V
Brady Statistic AP VP Percent: 71.76 %
Brady Statistic AP VS Percent: 0 %
Brady Statistic AS VP Percent: 28.24 %
Brady Statistic AS VS Percent: 0 %
Brady Statistic RA Percent Paced: 71.74 %
Brady Statistic RV Percent Paced: 99.99 %
Date Time Interrogation Session: 20220708140802
Implantable Lead Implant Date: 20170517
Implantable Lead Implant Date: 20170517
Implantable Lead Location: 753859
Implantable Lead Location: 753860
Implantable Lead Model: 5076
Implantable Lead Model: 5076
Implantable Pulse Generator Implant Date: 20170517
Lead Channel Impedance Value: 342 Ohm
Lead Channel Impedance Value: 361 Ohm
Lead Channel Impedance Value: 380 Ohm
Lead Channel Impedance Value: 418 Ohm
Lead Channel Pacing Threshold Amplitude: 0.625 V
Lead Channel Pacing Threshold Amplitude: 0.625 V
Lead Channel Pacing Threshold Pulse Width: 0.4 ms
Lead Channel Pacing Threshold Pulse Width: 0.4 ms
Lead Channel Sensing Intrinsic Amplitude: 2 mV
Lead Channel Sensing Intrinsic Amplitude: 2 mV
Lead Channel Sensing Intrinsic Amplitude: 31.625 mV
Lead Channel Sensing Intrinsic Amplitude: 31.625 mV
Lead Channel Setting Pacing Amplitude: 1.5 V
Lead Channel Setting Pacing Amplitude: 2.5 V
Lead Channel Setting Pacing Pulse Width: 0.4 ms
Lead Channel Setting Sensing Sensitivity: 2.8 mV

## 2020-12-19 DIAGNOSIS — E782 Mixed hyperlipidemia: Secondary | ICD-10-CM | POA: Diagnosis not present

## 2020-12-19 DIAGNOSIS — I10 Essential (primary) hypertension: Secondary | ICD-10-CM | POA: Diagnosis not present

## 2020-12-19 DIAGNOSIS — E1121 Type 2 diabetes mellitus with diabetic nephropathy: Secondary | ICD-10-CM | POA: Diagnosis not present

## 2020-12-19 DIAGNOSIS — Z20822 Contact with and (suspected) exposure to covid-19: Secondary | ICD-10-CM | POA: Diagnosis not present

## 2020-12-25 NOTE — Progress Notes (Signed)
Remote pacemaker transmission.   

## 2021-02-13 ENCOUNTER — Other Ambulatory Visit: Payer: Self-pay

## 2021-02-13 ENCOUNTER — Encounter: Payer: Self-pay | Admitting: Cardiology

## 2021-02-13 ENCOUNTER — Ambulatory Visit (INDEPENDENT_AMBULATORY_CARE_PROVIDER_SITE_OTHER): Payer: Medicare Other | Admitting: Cardiology

## 2021-02-13 VITALS — BP 104/68 | HR 82 | Ht 66.0 in | Wt 209.0 lb

## 2021-02-13 DIAGNOSIS — I442 Atrioventricular block, complete: Secondary | ICD-10-CM

## 2021-02-13 NOTE — Progress Notes (Signed)
Electrophysiology Office Note   Date:  02/13/2021   ID:  Garrit, Marrow 1936-04-26, MRN [id number]  PCP:  Deland Pretty, MD  Primary Electrophysiologist:  Constance Haw, MD    No chief complaint on file.    History of Present Illness: Travis Mercado is a 85 y.o. male who presents today for electrophysiology evaluation.     He has a history significant for hypertension, hyperlipidemia, CAD, diabetes.  He presented with complete heart block.  He is now status post Medtronic dual-chamber pacemaker implanted 10/11/2015.  Today, denies symptoms of palpitations, chest pain, shortness of breath, orthopnea, PND, lower extremity edema, claudication, dizziness, presyncope, syncope, bleeding, or neurologic sequela. The patient is tolerating medications without difficulties.  He currently feels well.  No chest pain or shortness of breath.  He is able to do all of his daily activities.  He has been traveling over the summer, going to go to [place] to fish.  He has not had any issues with his travel.  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    HSV infection    HTN (hypertension)    Hyperlipidemia 06/05/2011   Presence of permanent cardiac pacemaker    Third degree heart block ([place]) 09/2015   Past Surgical History:  Procedure Laterality Date   APPENDECTOMY     EP IMPLANTABLE DEVICE N/A 10/11/2015   Procedure: Pacemaker Implant;  Surgeon: Sydney Azure Meredith Leeds, MD;  Location: [facility];  Service: Cardiovascular;  Laterality: N/A;   EXTRACORPOREAL SHOCK WAVE LITHOTRIPSY Right 02/07/2020   Procedure: EXTRACORPOREAL SHOCK WAVE LITHOTRIPSY (ESWL);  Surgeon: Festus Aloe, MD;  Location: [facility];  Service: Urology;  Laterality: Right;     Current Outpatient Medications  Medication  Sig Dispense Refill   amlodipine-olmesartan (AZOR) 10-20 MG tablet Take 1 tablet by mouth daily.     aspirin EC 81 MG EC tablet Take 1 tablet (81 mg total) by mouth daily.     cholecalciferol (VITAMIN D) 1000 UNITS tablet Take 1,000 Units by mouth daily.     ezetimibe-simvastatin (VYTORIN) 10-40 MG per tablet Take 1 tablet by mouth at bedtime.     hydrOXYzine (ATARAX/VISTARIL) 25 MG tablet Take 25 mg by mouth 3 (three) times daily as needed.     metFORMIN (GLUCOPHAGE) 500 MG tablet Take 500 mg by mouth 2 (two) times daily.     metoprolol tartrate (LOPRESSOR) 50 MG tablet Take 1 tablet (50 mg total) by mouth 2 (two) times daily. 180 tablet 3   oxyCODONE-acetaminophen (PERCOCET/ROXICET) 5-325 MG tablet Take 1 tablet by mouth every 4 (four) hours as needed for severe pain. 15 tablet 0   pantoprazole (PROTONIX) 40 MG tablet Take 40 mg by mouth daily.     simvastatin (ZOCOR) 40 MG tablet Take 40 mg by mouth daily.     No current facility-administered medications for this visit.    Allergies:   Rosuvastatin calcium and Sulfacetamide sodium   Social History:  The patient  reports that he quit smoking about 37 years ago. His smoking use included cigarettes. He has never used smokeless tobacco. He reports that he does not drink alcohol and does not use drugs.   Family History:  The patient's family history includes Alzheimer's disease in his father; Cancer in  his mother; Diabetes in his father and mother; Heart disease in his father; Hypertension in his brother; Prostate cancer in his brother and father.   ROS:  Please see the history of present illness.   Otherwise, review of systems is positive for none.   All other systems are reviewed and negative.   PHYSICAL EXAM: VS:  BP 104/68   Pulse 82   Ht 5\' 6"  (1.676 m)   Wt 209 lb (94.8 kg)   SpO2 97%   BMI 33.73 kg/m  , BMI Body mass index is 33.73 kg/m. GEN: Well nourished, well developed, in no acute distress  HEENT: normal  Neck: no JVD,  carotid bruits, or masses Cardiac: RRR; no murmurs, rubs, or gallops,no edema  Respiratory:  clear to auscultation bilaterally, normal work of breathing GI: soft, nontender, nondistended, + BS MS: no deformity or atrophy  Skin: warm and dry, device site well healed Neuro:  Strength and sensation are intact Psych: euthymic mood, full affect  EKG:  EKG is ordered today. Personal review of the ekg ordered shows AV paced, rate 82  Personal review of the device interrogation today. Results in [place]: No results found for requested labs within last 8760 hours.    Lipid Panel     Component Value Date/Time   CHOL 132 10/11/2015 0525   TRIG 80 10/11/2015 0525   HDL 57 10/11/2015 0525   CHOLHDL 2.3 10/11/2015 0525   VLDL 16 10/11/2015 0525   LDLCALC 59 10/11/2015 0525     Wt Readings from Last 3 Encounters:  02/13/21 209 lb (94.8 kg)  06/26/20 208 lb (94.3 kg)  02/07/20 213 lb (96.6 kg)      Other studies Reviewed: Additional studies/ records that were reviewed today include: TTE 10/11/15  Review of the above records today demonstrates:  - Left ventricle: The cavity size was normal. Wall thickness was   normal. Systolic function was normal. The estimated ejection   fraction was in the range of 60% to 65%. Wall motion was normal;   there were no regional wall motion abnormalities. Doppler   parameters are consistent with abnormal left ventricular   relaxation (grade 1 diastolic dysfunction). - Aortic valve: Trileaflet; moderately thickened, moderately   calcified leaflets. - Mitral valve: There was trivial regurgitation. - Tricuspid valve: There was mild regurgitation.   ASSESSMENT AND PLAN:  1.  Complete heart block: Status post Medtronic dual-chamber pacemaker implanted 10/11/2015.  Device functioning appropriately.  No changes at this time.  2.  Hypertension: Currently well  3.  Hyperlipidemia: Continue Vytorin per primary physician   Current  medicines are reviewed at length with the patient today.   The patient does not have concerns regarding his medicines.  The following changes were made today: None  Labs/ tests ordered today include:  Orders Placed This Encounter  Procedures   EKG 12-Lead      Disposition:   FU with Bailie Christenbury 12 months  Signed, Jamieon Lannen Meredith Leeds, MD  02/13/2021 2:07 PM     [place] [address] [phone] (office) [phone] (fax)

## 2021-03-02 ENCOUNTER — Ambulatory Visit (INDEPENDENT_AMBULATORY_CARE_PROVIDER_SITE_OTHER): Payer: Medicare Other

## 2021-03-02 DIAGNOSIS — I442 Atrioventricular block, complete: Secondary | ICD-10-CM | POA: Diagnosis not present

## 2021-03-06 LAB — CUP PACEART REMOTE DEVICE CHECK
Battery Remaining Longevity: 42 mo
Battery Voltage: 2.98 V
Brady Statistic AP VP Percent: 64.29 %
Brady Statistic AP VS Percent: 0 %
Brady Statistic AS VP Percent: 35.71 %
Brady Statistic AS VS Percent: 0 %
Brady Statistic RA Percent Paced: 64.25 %
Brady Statistic RV Percent Paced: 99.99 %
Date Time Interrogation Session: 20221007141821
Implantable Lead Implant Date: 20170517
Implantable Lead Implant Date: 20170517
Implantable Lead Location: 753859
Implantable Lead Location: 753860
Implantable Lead Model: 5076
Implantable Lead Model: 5076
Implantable Pulse Generator Implant Date: 20170517
Lead Channel Impedance Value: 342 Ohm
Lead Channel Impedance Value: 342 Ohm
Lead Channel Impedance Value: 361 Ohm
Lead Channel Impedance Value: 399 Ohm
Lead Channel Pacing Threshold Amplitude: 0.625 V
Lead Channel Pacing Threshold Amplitude: 0.625 V
Lead Channel Pacing Threshold Pulse Width: 0.4 ms
Lead Channel Pacing Threshold Pulse Width: 0.4 ms
Lead Channel Sensing Intrinsic Amplitude: 2.125 mV
Lead Channel Sensing Intrinsic Amplitude: 2.125 mV
Lead Channel Sensing Intrinsic Amplitude: 31.625 mV
Lead Channel Sensing Intrinsic Amplitude: 31.625 mV
Lead Channel Setting Pacing Amplitude: 1.5 V
Lead Channel Setting Pacing Amplitude: 2.5 V
Lead Channel Setting Pacing Pulse Width: 0.4 ms
Lead Channel Setting Sensing Sensitivity: 2.8 mV

## 2021-03-13 NOTE — Progress Notes (Signed)
Remote pacemaker transmission.   

## 2021-04-26 DIAGNOSIS — N201 Calculus of ureter: Secondary | ICD-10-CM | POA: Diagnosis not present

## 2021-04-27 DIAGNOSIS — H35033 Hypertensive retinopathy, bilateral: Secondary | ICD-10-CM | POA: Diagnosis not present

## 2021-04-27 DIAGNOSIS — D23112 Other benign neoplasm of skin of right lower eyelid, including canthus: Secondary | ICD-10-CM | POA: Diagnosis not present

## 2021-04-27 DIAGNOSIS — H26493 Other secondary cataract, bilateral: Secondary | ICD-10-CM | POA: Diagnosis not present

## 2021-04-27 DIAGNOSIS — E113291 Type 2 diabetes mellitus with mild nonproliferative diabetic retinopathy without macular edema, right eye: Secondary | ICD-10-CM | POA: Diagnosis not present

## 2021-05-28 ENCOUNTER — Other Ambulatory Visit: Payer: Self-pay | Admitting: Cardiology

## 2021-06-01 ENCOUNTER — Ambulatory Visit (INDEPENDENT_AMBULATORY_CARE_PROVIDER_SITE_OTHER): Payer: Medicare Other

## 2021-06-01 DIAGNOSIS — I442 Atrioventricular block, complete: Secondary | ICD-10-CM

## 2021-06-04 LAB — CUP PACEART REMOTE DEVICE CHECK
Battery Remaining Longevity: 37 mo
Battery Voltage: 2.97 V
Brady Statistic AP VP Percent: 72.38 %
Brady Statistic AP VS Percent: 0 %
Brady Statistic AS VP Percent: 27.61 %
Brady Statistic AS VS Percent: 0 %
Brady Statistic RA Percent Paced: 72.25 %
Brady Statistic RV Percent Paced: 99.98 %
Date Time Interrogation Session: 20230106150502
Implantable Lead Implant Date: 20170517
Implantable Lead Implant Date: 20170517
Implantable Lead Location: 753859
Implantable Lead Location: 753860
Implantable Lead Model: 5076
Implantable Lead Model: 5076
Implantable Pulse Generator Implant Date: 20170517
Lead Channel Impedance Value: 361 Ohm
Lead Channel Impedance Value: 380 Ohm
Lead Channel Impedance Value: 380 Ohm
Lead Channel Impedance Value: 437 Ohm
Lead Channel Pacing Threshold Amplitude: 0.5 V
Lead Channel Pacing Threshold Amplitude: 0.625 V
Lead Channel Pacing Threshold Pulse Width: 0.4 ms
Lead Channel Pacing Threshold Pulse Width: 0.4 ms
Lead Channel Sensing Intrinsic Amplitude: 18.25 mV
Lead Channel Sensing Intrinsic Amplitude: 18.25 mV
Lead Channel Sensing Intrinsic Amplitude: 2.375 mV
Lead Channel Sensing Intrinsic Amplitude: 2.375 mV
Lead Channel Setting Pacing Amplitude: 1.5 V
Lead Channel Setting Pacing Amplitude: 2.5 V
Lead Channel Setting Pacing Pulse Width: 0.4 ms
Lead Channel Setting Sensing Sensitivity: 2.8 mV

## 2021-06-12 NOTE — Progress Notes (Signed)
Remote pacemaker transmission.   

## 2021-06-27 DIAGNOSIS — E1121 Type 2 diabetes mellitus with diabetic nephropathy: Secondary | ICD-10-CM | POA: Diagnosis not present

## 2021-06-27 DIAGNOSIS — I10 Essential (primary) hypertension: Secondary | ICD-10-CM | POA: Diagnosis not present

## 2021-06-27 DIAGNOSIS — E119 Type 2 diabetes mellitus without complications: Secondary | ICD-10-CM | POA: Diagnosis not present

## 2021-07-04 DIAGNOSIS — E1121 Type 2 diabetes mellitus with diabetic nephropathy: Secondary | ICD-10-CM | POA: Diagnosis not present

## 2021-07-04 DIAGNOSIS — D509 Iron deficiency anemia, unspecified: Secondary | ICD-10-CM | POA: Diagnosis not present

## 2021-07-04 DIAGNOSIS — N1832 Chronic kidney disease, stage 3b: Secondary | ICD-10-CM | POA: Diagnosis not present

## 2021-07-04 DIAGNOSIS — H9313 Tinnitus, bilateral: Secondary | ICD-10-CM | POA: Diagnosis not present

## 2021-07-04 DIAGNOSIS — I700 Atherosclerosis of aorta: Secondary | ICD-10-CM | POA: Diagnosis not present

## 2021-07-04 DIAGNOSIS — Z1212 Encounter for screening for malignant neoplasm of rectum: Secondary | ICD-10-CM | POA: Diagnosis not present

## 2021-07-04 DIAGNOSIS — Z0000 Encounter for general adult medical examination without abnormal findings: Secondary | ICD-10-CM | POA: Diagnosis not present

## 2021-07-04 DIAGNOSIS — I10 Essential (primary) hypertension: Secondary | ICD-10-CM | POA: Diagnosis not present

## 2021-07-04 DIAGNOSIS — K219 Gastro-esophageal reflux disease without esophagitis: Secondary | ICD-10-CM | POA: Diagnosis not present

## 2021-07-24 DIAGNOSIS — I10 Essential (primary) hypertension: Secondary | ICD-10-CM | POA: Diagnosis not present

## 2021-07-24 DIAGNOSIS — E1121 Type 2 diabetes mellitus with diabetic nephropathy: Secondary | ICD-10-CM | POA: Diagnosis not present

## 2021-07-24 DIAGNOSIS — E782 Mixed hyperlipidemia: Secondary | ICD-10-CM | POA: Diagnosis not present

## 2021-08-02 DIAGNOSIS — H9313 Tinnitus, bilateral: Secondary | ICD-10-CM | POA: Diagnosis not present

## 2021-08-02 DIAGNOSIS — H903 Sensorineural hearing loss, bilateral: Secondary | ICD-10-CM | POA: Diagnosis not present

## 2021-09-10 ENCOUNTER — Telehealth: Payer: Self-pay | Admitting: Cardiology

## 2021-09-10 DIAGNOSIS — D509 Iron deficiency anemia, unspecified: Secondary | ICD-10-CM | POA: Diagnosis not present

## 2021-09-10 DIAGNOSIS — L309 Dermatitis, unspecified: Secondary | ICD-10-CM | POA: Diagnosis not present

## 2021-09-10 NOTE — Telephone Encounter (Signed)
?  1. Has your device fired? no ? ?2. Is you device beeping? no ? ?3. Are you experiencing draining or swelling at device site? no ? ?4. Are you calling to see if we received your device transmission? Yes, patient states he missed sending his transmission.  He would like to send one today or tomorrow.  ? ?5. Have you passed out? no ? ? ? ?Please route to Device Clinic Pool  ?

## 2021-09-13 NOTE — Telephone Encounter (Signed)
I spoke with the patient and he is going to send missed transmission today. ?

## 2021-09-14 ENCOUNTER — Ambulatory Visit (INDEPENDENT_AMBULATORY_CARE_PROVIDER_SITE_OTHER): Payer: Medicare Other

## 2021-09-14 DIAGNOSIS — I442 Atrioventricular block, complete: Secondary | ICD-10-CM

## 2021-09-17 LAB — CUP PACEART REMOTE DEVICE CHECK
Battery Remaining Longevity: 34 mo
Battery Voltage: 2.96 V
Brady Statistic AP VP Percent: 64.55 %
Brady Statistic AP VS Percent: 0 %
Brady Statistic AS VP Percent: 35.44 %
Brady Statistic AS VS Percent: 0.01 %
Brady Statistic RA Percent Paced: 64.42 %
Brady Statistic RV Percent Paced: 99.95 %
Date Time Interrogation Session: 20230420164747
Implantable Lead Implant Date: 20170517
Implantable Lead Implant Date: 20170517
Implantable Lead Location: 753859
Implantable Lead Location: 753860
Implantable Lead Model: 5076
Implantable Lead Model: 5076
Implantable Pulse Generator Implant Date: 20170517
Lead Channel Impedance Value: 342 Ohm
Lead Channel Impedance Value: 342 Ohm
Lead Channel Impedance Value: 361 Ohm
Lead Channel Impedance Value: 399 Ohm
Lead Channel Pacing Threshold Amplitude: 0.5 V
Lead Channel Pacing Threshold Amplitude: 0.75 V
Lead Channel Pacing Threshold Pulse Width: 0.4 ms
Lead Channel Pacing Threshold Pulse Width: 0.4 ms
Lead Channel Sensing Intrinsic Amplitude: 15.625 mV
Lead Channel Sensing Intrinsic Amplitude: 15.625 mV
Lead Channel Sensing Intrinsic Amplitude: 2.25 mV
Lead Channel Sensing Intrinsic Amplitude: 2.25 mV
Lead Channel Setting Pacing Amplitude: 1.5 V
Lead Channel Setting Pacing Amplitude: 2.5 V
Lead Channel Setting Pacing Pulse Width: 0.4 ms
Lead Channel Setting Sensing Sensitivity: 2.8 mV

## 2021-10-02 NOTE — Progress Notes (Signed)
Remote pacemaker transmission.   

## 2021-10-09 DIAGNOSIS — D509 Iron deficiency anemia, unspecified: Secondary | ICD-10-CM | POA: Diagnosis not present

## 2021-10-30 ENCOUNTER — Other Ambulatory Visit: Payer: Self-pay | Admitting: Cardiothoracic Surgery

## 2021-10-30 DIAGNOSIS — I712 Thoracic aortic aneurysm, without rupture, unspecified: Secondary | ICD-10-CM

## 2021-11-21 DIAGNOSIS — E782 Mixed hyperlipidemia: Secondary | ICD-10-CM | POA: Diagnosis not present

## 2021-11-21 DIAGNOSIS — E1121 Type 2 diabetes mellitus with diabetic nephropathy: Secondary | ICD-10-CM | POA: Diagnosis not present

## 2021-11-21 DIAGNOSIS — I10 Essential (primary) hypertension: Secondary | ICD-10-CM | POA: Diagnosis not present

## 2021-12-04 DIAGNOSIS — L819 Disorder of pigmentation, unspecified: Secondary | ICD-10-CM | POA: Diagnosis not present

## 2021-12-04 DIAGNOSIS — S40922A Unspecified superficial injury of left upper arm, initial encounter: Secondary | ICD-10-CM | POA: Diagnosis not present

## 2021-12-04 DIAGNOSIS — S1090XA Unspecified superficial injury of unspecified part of neck, initial encounter: Secondary | ICD-10-CM | POA: Diagnosis not present

## 2021-12-04 DIAGNOSIS — L309 Dermatitis, unspecified: Secondary | ICD-10-CM | POA: Diagnosis not present

## 2021-12-04 DIAGNOSIS — L299 Pruritus, unspecified: Secondary | ICD-10-CM | POA: Diagnosis not present

## 2021-12-04 DIAGNOSIS — S40921A Unspecified superficial injury of right upper arm, initial encounter: Secondary | ICD-10-CM | POA: Diagnosis not present

## 2021-12-14 ENCOUNTER — Ambulatory Visit (INDEPENDENT_AMBULATORY_CARE_PROVIDER_SITE_OTHER): Payer: Medicare Other

## 2021-12-14 DIAGNOSIS — I442 Atrioventricular block, complete: Secondary | ICD-10-CM | POA: Diagnosis not present

## 2021-12-16 LAB — CUP PACEART REMOTE DEVICE CHECK
Battery Remaining Longevity: 32 mo
Battery Voltage: 2.96 V
Brady Statistic AP VP Percent: 62.47 %
Brady Statistic AP VS Percent: 0 %
Brady Statistic AS VP Percent: 37.52 %
Brady Statistic AS VS Percent: 0.01 %
Brady Statistic RA Percent Paced: 62.4 %
Brady Statistic RV Percent Paced: 99.96 %
Date Time Interrogation Session: 20230721172214
Implantable Lead Implant Date: 20170517
Implantable Lead Implant Date: 20170517
Implantable Lead Location: 753859
Implantable Lead Location: 753860
Implantable Lead Model: 5076
Implantable Lead Model: 5076
Implantable Pulse Generator Implant Date: 20170517
Lead Channel Impedance Value: 342 Ohm
Lead Channel Impedance Value: 342 Ohm
Lead Channel Impedance Value: 380 Ohm
Lead Channel Impedance Value: 418 Ohm
Lead Channel Pacing Threshold Amplitude: 0.5 V
Lead Channel Pacing Threshold Amplitude: 0.75 V
Lead Channel Pacing Threshold Pulse Width: 0.4 ms
Lead Channel Pacing Threshold Pulse Width: 0.4 ms
Lead Channel Sensing Intrinsic Amplitude: 19.625 mV
Lead Channel Sensing Intrinsic Amplitude: 19.625 mV
Lead Channel Sensing Intrinsic Amplitude: 2.25 mV
Lead Channel Sensing Intrinsic Amplitude: 2.25 mV
Lead Channel Setting Pacing Amplitude: 1.5 V
Lead Channel Setting Pacing Amplitude: 2.5 V
Lead Channel Setting Pacing Pulse Width: 0.4 ms
Lead Channel Setting Sensing Sensitivity: 2.8 mV

## 2021-12-31 ENCOUNTER — Ambulatory Visit
Admission: RE | Admit: 2021-12-31 | Discharge: 2021-12-31 | Disposition: A | Discharge status: Home / Self Care | Payer: Medicare Other | Source: Ambulatory Visit | Attending: Cardiothoracic Surgery | Admitting: Cardiothoracic Surgery

## 2021-12-31 ENCOUNTER — Ambulatory Visit (INDEPENDENT_AMBULATORY_CARE_PROVIDER_SITE_OTHER): Payer: Medicare Other | Admitting: Surgical

## 2021-12-31 VITALS — BP 108/72 | HR 89 | Resp 20 | Ht 66.0 in

## 2021-12-31 DIAGNOSIS — I7121 Aneurysm of the ascending aorta, without rupture: Secondary | ICD-10-CM | POA: Diagnosis not present

## 2021-12-31 DIAGNOSIS — I712 Thoracic aortic aneurysm, without rupture, unspecified: Secondary | ICD-10-CM

## 2021-12-31 NOTE — Progress Notes (Signed)
Subjective:     Patient ID: Travis Mercado, male    DOB: 07-17-1935, 86 y.o.   MRN: [id number]  Chief Complaint  Patient presents with   Thoracic Aortic Aneurysm    18 month f/u with chest CT    HPI Patient is in today for ongoing surveillance of his thoracic aortic aneurysm.  He was last seen in January 2022 by Dr. Julien Girt at which time the aneurysm continue to measure a stable 4.0 cm.  He underwent repeat scan on the seventh of this month with a full report as described below.  Diameter is also noted to be stable at 4.0 cm.  Review of Systems  Constitutional: Negative.   HENT: Negative.    Eyes: Negative.   Respiratory: Negative.    Cardiovascular: Negative.   Gastrointestinal:  Positive for heartburn.  Genitourinary:        Urinary hesitancy  Musculoskeletal: Negative.   Skin: Negative.   Neurological: Negative.    Current Outpatient Medications  Medication Instructions   amlodipine-olmesartan (AZOR) 10-20 MG tablet 1 tablet, Oral, Daily   aspirin EC 81 mg, Oral, Daily   cholecalciferol (VITAMIN D) 1,000 Units, Daily   ezetimibe-simvastatin (VYTORIN) 10-40 MG per tablet 1 tablet, Daily at bedtime   hydrOXYzine (ATARAX) 25 mg, 3 times daily PRN   metFORMIN (GLUCOPHAGE) 500 mg, Oral, 2 times daily   metoprolol tartrate (LOPRESSOR) 50 MG tablet TAKE 1 TABLET BY MOUTH TWICE A DAY   pantoprazole (PROTONIX) 40 mg, Oral, Daily      Objective:    BP 108/72 (BP Location: Right Arm, Patient Position: Sitting, Cuff Size: Normal)   Pulse 89   Resp 20   Ht '5\' 6"'$  (1.676 m)   SpO2 95% Comment: RA  BMI 33.73 kg/m  BP Readings from Last 3 Encounters:  12/31/21 108/72  02/13/21 104/68  06/26/20 105/72   Wt Readings from Last 3 Encounters:  02/13/21 209 lb (94.8 kg)  06/26/20 208 lb (94.3 kg)  02/07/20 213 lb (96.6 kg)      Physical Exam Constitutional:      General: He is not in acute distress.    Appearance: Normal appearance. He is not ill-appearing.  HENT:      Head: Normocephalic and atraumatic.  Cardiovascular:     Rate and Rhythm: Normal rate and regular rhythm.     Heart sounds: No murmur heard.    No gallop.  Pulmonary:     Effort: Pulmonary effort is normal.     Breath sounds: Normal breath sounds.  Abdominal:     General: There is no distension.     Tenderness: There is no abdominal tenderness.     Comments: Protuberant  Musculoskeletal:     Right lower leg: No edema.     Left lower leg: No edema.     Comments: Varicose veins  Neurological:     General: No focal deficit present.     Mental Status: He is alert and oriented to person, place, and time.  Psychiatric:        Mood and Affect: Mood normal.        Behavior: Behavior normal.        Thought Content: Thought content normal.     No results found for any visits on 12/31/21. Narrative & Impression  CLINICAL DATA:  Follow-up thoracic aortic aneurysm   EXAM: CT CHEST WITHOUT CONTRAST   TECHNIQUE: Multidetector CT imaging of the chest was performed following the standard  protocol without IV contrast.   RADIATION DOSE REDUCTION: This exam was performed according to the departmental dose-optimization program which includes automated exposure control, adjustment of the mA and/or kV according to patient size and/or use of iterative reconstruction technique.   COMPARISON:  06/08/2020   FINDINGS: Cardiovascular: Mid ascending thoracic aortic aneurysm measures approximately 4.2 cm in diameter (previously 4.0 cm). Tortuosity of the descending thoracic aorta. Atherosclerotic calcifications of the aorta and coronary arteries. Heart size is normal. Pulmonary vasculature is nondilated. Left-sided implanted cardiac device remains in place.   Mediastinum/Nodes: No enlarged mediastinal or axillary lymph nodes. Thyroid gland, trachea, and esophagus demonstrate no significant findings.   Lungs/Pleura: Lungs are clear. No pleural effusion or pneumothorax.   Upper Abdomen: No  acute abnormality.   Musculoskeletal: No chest wall mass or suspicious bone lesions identified.   IMPRESSION: 1. Mid ascending thoracic aortic aneurysm measures approximately 4.2 cm in diameter (previously 4.0 cm). Recommend annual imaging followup by CTA or MRA. This recommendation follows 2010 ACCF/AHA/AATS/ACR/ASA/SCA/SCAI/SIR/STS/SVM Guidelines for the Diagnosis and Management of Patients with Thoracic Aortic Disease. Circulation. 2010; 121: U633-H545. Aortic aneurysm NOS (ICD10-I71.9) 2. Aortic and coronary artery atherosclerosis (ICD10-I70.0).     Electronically Signed   By: Davina Poke D.O.   On: 12/31/2021 12:18       Assessment & Plan:   Problem List Items Addressed This Visit     Fusiform Aneurysm, ascending aorta - Primary    No orders of the defined types were placed in this encounter. A/P: Pretty stable stable ascending thoracic aortic aneurysm with measurements approximately 4.2 cm in diameter.  His blood pressure has been well controlled.  His diabetes has been under good control per patient report.  He is symptom-free in regards to cardiac symptoms.  He does workout approximately 3 days a week at the gym primarily ambulating and some light weight dumbbell lifting.  He plans to move to [place] in the [place] area and will attempt to find a CT surgery practice there to continue surveillance.  I told him he can contact our office for records.  We will make an appointment for 1 year with a CTA of the chest and follow-up in case he has any difficulty arranging care down there as he still does get to the local [place] area occasionally.  No follow-ups on file.  John Giovanni, PA-C

## 2021-12-31 NOTE — Patient Instructions (Signed)
Continue close monitoring of blood pressure and diabetes.  Continue active lifestyle with good nutrition and exercise habits.

## 2021-12-31 NOTE — Progress Notes (Signed)
Remote pacemaker transmission.   

## 2022-01-11 DIAGNOSIS — D509 Iron deficiency anemia, unspecified: Secondary | ICD-10-CM | POA: Diagnosis not present

## 2022-01-31 ENCOUNTER — Telehealth: Payer: Self-pay | Admitting: Cardiology

## 2022-01-31 NOTE — Telephone Encounter (Signed)
Patient is calling stating he is moving to [place] and is wanting to know if Dr. Curt Bears knows of a cardiologist in that area he can transfer his heart care over to. Please advise.

## 2022-02-01 NOTE — Telephone Encounter (Signed)
Patient returned call

## 2022-02-06 NOTE — Telephone Encounter (Signed)
Pt informed that Dr. Curt Bears did not know of another cardiologist in that area. Aware I will see if any other MDs in the office know of anyone and aware I will send the information via mychart if one is recommended. Patient verbalized understanding and agreeable to plan.    He asked about monitoring.  Pt aware we will continue to monitor until he can establish with another practice.

## 2022-03-15 ENCOUNTER — Ambulatory Visit (INDEPENDENT_AMBULATORY_CARE_PROVIDER_SITE_OTHER): Payer: Medicare Other

## 2022-03-15 DIAGNOSIS — I441 Atrioventricular block, second degree: Secondary | ICD-10-CM

## 2022-03-18 LAB — CUP PACEART REMOTE DEVICE CHECK
Battery Remaining Longevity: 29 mo
Battery Voltage: 2.95 V
Brady Statistic AP VP Percent: 77.19 %
Brady Statistic AP VS Percent: 0 %
Brady Statistic AS VP Percent: 22.81 %
Brady Statistic AS VS Percent: 0 %
Brady Statistic RA Percent Paced: 77.13 %
Brady Statistic RV Percent Paced: 99.98 %
Date Time Interrogation Session: 20231020162409
Implantable Lead Implant Date: 20170517
Implantable Lead Implant Date: 20170517
Implantable Lead Location: 753859
Implantable Lead Location: 753860
Implantable Lead Model: 5076
Implantable Lead Model: 5076
Implantable Pulse Generator Implant Date: 20170517
Lead Channel Impedance Value: 342 Ohm
Lead Channel Impedance Value: 361 Ohm
Lead Channel Impedance Value: 380 Ohm
Lead Channel Impedance Value: 418 Ohm
Lead Channel Pacing Threshold Amplitude: 0.5 V
Lead Channel Pacing Threshold Amplitude: 0.875 V
Lead Channel Pacing Threshold Pulse Width: 0.4 ms
Lead Channel Pacing Threshold Pulse Width: 0.4 ms
Lead Channel Sensing Intrinsic Amplitude: 2.25 mV
Lead Channel Sensing Intrinsic Amplitude: 2.25 mV
Lead Channel Sensing Intrinsic Amplitude: 6.375 mV
Lead Channel Sensing Intrinsic Amplitude: 6.375 mV
Lead Channel Setting Pacing Amplitude: 1.5 V
Lead Channel Setting Pacing Amplitude: 2.5 V
Lead Channel Setting Pacing Pulse Width: 0.4 ms
Lead Channel Setting Sensing Sensitivity: 2.8 mV

## 2022-03-21 NOTE — Progress Notes (Signed)
Remote pacemaker transmission.   

## 2022-04-04 DIAGNOSIS — I10 Essential (primary) hypertension: Secondary | ICD-10-CM | POA: Diagnosis not present

## 2022-04-04 DIAGNOSIS — E782 Mixed hyperlipidemia: Secondary | ICD-10-CM | POA: Diagnosis not present

## 2022-04-04 DIAGNOSIS — Z950 Presence of cardiac pacemaker: Secondary | ICD-10-CM | POA: Diagnosis not present

## 2022-04-04 DIAGNOSIS — E119 Type 2 diabetes mellitus without complications: Secondary | ICD-10-CM | POA: Diagnosis not present

## 2022-06-14 ENCOUNTER — Ambulatory Visit: Payer: Medicare Other | Attending: Cardiology

## 2022-06-14 DIAGNOSIS — I441 Atrioventricular block, second degree: Secondary | ICD-10-CM

## 2022-06-18 LAB — CUP PACEART REMOTE DEVICE CHECK
Battery Remaining Longevity: 26 mo
Battery Voltage: 2.94 V
Brady Statistic AP VP Percent: 82.59 %
Brady Statistic AP VS Percent: 0.01 %
Brady Statistic AS VP Percent: 17.39 %
Brady Statistic AS VS Percent: 0.01 %
Brady Statistic RA Percent Paced: 82.52 %
Brady Statistic RV Percent Paced: 99.95 %
Date Time Interrogation Session: 20240122123639
Implantable Lead Connection Status: 753985
Implantable Lead Connection Status: 753985
Implantable Lead Implant Date: 20170517
Implantable Lead Implant Date: 20170517
Implantable Lead Location: 753859
Implantable Lead Location: 753860
Implantable Lead Model: 5076
Implantable Lead Model: 5076
Implantable Pulse Generator Implant Date: 20170517
Lead Channel Impedance Value: 342 Ohm
Lead Channel Impedance Value: 361 Ohm
Lead Channel Impedance Value: 380 Ohm
Lead Channel Impedance Value: 399 Ohm
Lead Channel Pacing Threshold Amplitude: 0.5 V
Lead Channel Pacing Threshold Amplitude: 0.75 V
Lead Channel Pacing Threshold Pulse Width: 0.4 ms
Lead Channel Pacing Threshold Pulse Width: 0.4 ms
Lead Channel Sensing Intrinsic Amplitude: 2.125 mV
Lead Channel Sensing Intrinsic Amplitude: 2.125 mV
Lead Channel Sensing Intrinsic Amplitude: 5.5 mV
Lead Channel Sensing Intrinsic Amplitude: 5.5 mV
Lead Channel Setting Pacing Amplitude: 1.5 V
Lead Channel Setting Pacing Amplitude: 2.5 V
Lead Channel Setting Pacing Pulse Width: 0.4 ms
Lead Channel Setting Sensing Sensitivity: 2.8 mV
Zone Setting Status: 755011
Zone Setting Status: 755011

## 2022-07-01 NOTE — Progress Notes (Signed)
Remote pacemaker transmission.   

## 2022-09-13 ENCOUNTER — Ambulatory Visit (INDEPENDENT_AMBULATORY_CARE_PROVIDER_SITE_OTHER): Payer: Medicare Other

## 2022-09-13 DIAGNOSIS — I441 Atrioventricular block, second degree: Secondary | ICD-10-CM | POA: Diagnosis not present

## 2022-09-16 LAB — CUP PACEART REMOTE DEVICE CHECK
Battery Remaining Longevity: 25 mo
Battery Voltage: 2.94 V
Brady Statistic AP VP Percent: 79.6 %
Brady Statistic AP VS Percent: 0.01 %
Brady Statistic AS VP Percent: 20.38 %
Brady Statistic AS VS Percent: 0.01 %
Brady Statistic RA Percent Paced: 79.51 %
Brady Statistic RV Percent Paced: 99.95 %
Date Time Interrogation Session: 20240422143824
Implantable Lead Connection Status: 753985
Implantable Lead Connection Status: 753985
Implantable Lead Implant Date: 20170517
Implantable Lead Implant Date: 20170517
Implantable Lead Location: 753859
Implantable Lead Location: 753860
Implantable Lead Model: 5076
Implantable Lead Model: 5076
Implantable Pulse Generator Implant Date: 20170517
Lead Channel Impedance Value: 342 Ohm
Lead Channel Impedance Value: 361 Ohm
Lead Channel Impedance Value: 380 Ohm
Lead Channel Impedance Value: 399 Ohm
Lead Channel Pacing Threshold Amplitude: 0.5 V
Lead Channel Pacing Threshold Amplitude: 0.75 V
Lead Channel Pacing Threshold Pulse Width: 0.4 ms
Lead Channel Pacing Threshold Pulse Width: 0.4 ms
Lead Channel Sensing Intrinsic Amplitude: 17.375 mV
Lead Channel Sensing Intrinsic Amplitude: 17.375 mV
Lead Channel Sensing Intrinsic Amplitude: 2.125 mV
Lead Channel Sensing Intrinsic Amplitude: 2.125 mV
Lead Channel Setting Pacing Amplitude: 1.5 V
Lead Channel Setting Pacing Amplitude: 2.5 V
Lead Channel Setting Pacing Pulse Width: 0.4 ms
Lead Channel Setting Sensing Sensitivity: 2.8 mV
Zone Setting Status: 755011
Zone Setting Status: 755011

## 2022-10-11 NOTE — Progress Notes (Signed)
Remote pacemaker transmission.   

## 2022-12-02 ENCOUNTER — Other Ambulatory Visit: Payer: Self-pay | Admitting: Cardiothoracic Surgery

## 2022-12-02 DIAGNOSIS — I7121 Aneurysm of the ascending aorta, without rupture: Secondary | ICD-10-CM

## 2022-12-13 ENCOUNTER — Ambulatory Visit (INDEPENDENT_AMBULATORY_CARE_PROVIDER_SITE_OTHER): Payer: Medicare Other

## 2022-12-13 DIAGNOSIS — I441 Atrioventricular block, second degree: Secondary | ICD-10-CM | POA: Diagnosis not present

## 2022-12-18 LAB — CUP PACEART REMOTE DEVICE CHECK
Battery Remaining Longevity: 22 mo
Battery Voltage: 2.92 V
Brady Statistic AP VP Percent: 79.18 %
Brady Statistic AP VS Percent: 0 %
Brady Statistic AS VP Percent: 20.8 %
Brady Statistic AS VS Percent: 0.01 %
Brady Statistic RA Percent Paced: 79.08 %
Brady Statistic RV Percent Paced: 99.95 %
Date Time Interrogation Session: 20240724082400
Implantable Lead Connection Status: 753985
Implantable Lead Connection Status: 753985
Implantable Lead Implant Date: 20170517
Implantable Lead Implant Date: 20170517
Implantable Lead Location: 753859
Implantable Lead Location: 753860
Implantable Lead Model: 5076
Implantable Lead Model: 5076
Implantable Pulse Generator Implant Date: 20170517
Lead Channel Impedance Value: 323 Ohm
Lead Channel Impedance Value: 342 Ohm
Lead Channel Impedance Value: 380 Ohm
Lead Channel Impedance Value: 380 Ohm
Lead Channel Pacing Threshold Amplitude: 0.5 V
Lead Channel Pacing Threshold Amplitude: 0.75 V
Lead Channel Pacing Threshold Pulse Width: 0.4 ms
Lead Channel Pacing Threshold Pulse Width: 0.4 ms
Lead Channel Sensing Intrinsic Amplitude: 17.375 mV
Lead Channel Sensing Intrinsic Amplitude: 17.375 mV
Lead Channel Sensing Intrinsic Amplitude: 2.125 mV
Lead Channel Sensing Intrinsic Amplitude: 2.125 mV
Lead Channel Setting Pacing Amplitude: 1.5 V
Lead Channel Setting Pacing Amplitude: 2.5 V
Lead Channel Setting Pacing Pulse Width: 0.4 ms
Lead Channel Setting Sensing Sensitivity: 2.8 mV
Zone Setting Status: 755011
Zone Setting Status: 755011

## 2022-12-26 NOTE — Progress Notes (Signed)
      [address]       [place] [address]             [phone]        [name] [id number] May 01, 1936  History of Present Illness:   Travis Mercado is an 87 yo male with history of HTN, DM and Ascending Aortic Aneurysm.  He was last evaluated in our office in 12/2021 at which time his aneurysm remained stable at 4.2 cm.  He presents today for 1 year surveillance    Current Outpatient Medications on File Prior to Visit  Medication Sig Dispense Refill   amlodipine-olmesartan (AZOR) 10-20 MG tablet Take 1 tablet by mouth daily.     aspirin EC 81 MG EC tablet Take 1 tablet (81 mg total) by mouth daily.     cholecalciferol (VITAMIN D) 1000 UNITS tablet Take 1,000 Units by mouth daily.     ezetimibe-simvastatin (VYTORIN) 10-40 MG per tablet Take 1 tablet by mouth at bedtime.     hydrOXYzine (ATARAX/VISTARIL) 25 MG tablet Take 25 mg by mouth 3 (three) times daily as needed.     metFORMIN (GLUCOPHAGE) 500 MG tablet Take 500 mg by mouth 2 (two) times daily.     metoprolol tartrate (LOPRESSOR) 50 MG tablet TAKE 1 TABLET BY MOUTH TWICE A DAY 180 tablet 2   pantoprazole (PROTONIX) 40 MG tablet Take 40 mg by mouth daily.     No current facility-administered medications on file prior to visit.      A/P:  Ascending Aortic Aneurysm- measuring  HTN DM    Risk Modification:  Statin:  Yes  Smoking cessation instruction/counseling given:    Patient was counseled on importance of Blood Pressure Control.  Despite Medical intervention if the patient notices persistently elevated blood pressure readings.  They are instructed to contact their Primary Care Physician  Please avoid use of Fluoroquinolones as this can potentially increase your risk of Aortic Rupture and/or Dissection  Patient educated on signs and symptoms of Aortic Dissection, handout also provided in AVS   , PA-C 12/26/22      NO SHOW

## 2022-12-26 NOTE — Progress Notes (Signed)
Remote pacemaker transmission.   

## 2023-01-02 ENCOUNTER — Other Ambulatory Visit: Payer: Medicare Other

## 2023-01-02 ENCOUNTER — Ambulatory Visit: Payer: Medicare Other | Admitting: Physician Assistant

## 2023-01-22 ENCOUNTER — Inpatient Hospital Stay: Admission: RE | Admit: 2023-01-22 | Payer: Medicare Other | Source: Ambulatory Visit

## 2023-03-14 ENCOUNTER — Ambulatory Visit (INDEPENDENT_AMBULATORY_CARE_PROVIDER_SITE_OTHER): Payer: Medicare Other

## 2023-03-14 DIAGNOSIS — I441 Atrioventricular block, second degree: Secondary | ICD-10-CM | POA: Diagnosis not present

## 2023-03-15 LAB — CUP PACEART REMOTE DEVICE CHECK
Battery Remaining Longevity: 19 mo
Battery Voltage: 2.92 V
Brady Statistic AP VP Percent: 78.05 %
Brady Statistic AP VS Percent: 0 %
Brady Statistic AS VP Percent: 21.95 %
Brady Statistic AS VS Percent: 0 %
Brady Statistic RA Percent Paced: 77.95 %
Brady Statistic RV Percent Paced: 99.99 %
Date Time Interrogation Session: 20241018103112
Implantable Lead Connection Status: 753985
Implantable Lead Connection Status: 753985
Implantable Lead Implant Date: 20170517
Implantable Lead Implant Date: 20170517
Implantable Lead Location: 753859
Implantable Lead Location: 753860
Implantable Lead Model: 5076
Implantable Lead Model: 5076
Implantable Pulse Generator Implant Date: 20170517
Lead Channel Impedance Value: 342 Ohm
Lead Channel Impedance Value: 361 Ohm
Lead Channel Impedance Value: 380 Ohm
Lead Channel Impedance Value: 418 Ohm
Lead Channel Pacing Threshold Amplitude: 0.5 V
Lead Channel Pacing Threshold Amplitude: 0.75 V
Lead Channel Pacing Threshold Pulse Width: 0.4 ms
Lead Channel Pacing Threshold Pulse Width: 0.4 ms
Lead Channel Sensing Intrinsic Amplitude: 2.125 mV
Lead Channel Sensing Intrinsic Amplitude: 2.125 mV
Lead Channel Sensing Intrinsic Amplitude: 4.375 mV
Lead Channel Sensing Intrinsic Amplitude: 4.375 mV
Lead Channel Setting Pacing Amplitude: 1.5 V
Lead Channel Setting Pacing Amplitude: 2.5 V
Lead Channel Setting Pacing Pulse Width: 0.4 ms
Lead Channel Setting Sensing Sensitivity: 2.8 mV
Zone Setting Status: 755011
Zone Setting Status: 755011

## 2023-03-28 NOTE — Progress Notes (Signed)
Remote pacemaker transmission.   

## 2023-06-13 ENCOUNTER — Ambulatory Visit: Payer: Medicare Other

## 2023-06-13 DIAGNOSIS — I441 Atrioventricular block, second degree: Secondary | ICD-10-CM | POA: Diagnosis not present

## 2023-06-18 LAB — CUP PACEART REMOTE DEVICE CHECK
Battery Remaining Longevity: 17 mo
Battery Voltage: 2.91 V
Brady Statistic AP VP Percent: 86.07 %
Brady Statistic AP VS Percent: 0 %
Brady Statistic AS VP Percent: 13.93 %
Brady Statistic AS VS Percent: 0 %
Brady Statistic RA Percent Paced: 85.66 %
Brady Statistic RV Percent Paced: 99.99 %
Date Time Interrogation Session: 20250122123652
Implantable Lead Connection Status: 753985
Implantable Lead Connection Status: 753985
Implantable Lead Implant Date: 20170517
Implantable Lead Implant Date: 20170517
Implantable Lead Location: 753859
Implantable Lead Location: 753860
Implantable Lead Model: 5076
Implantable Lead Model: 5076
Implantable Pulse Generator Implant Date: 20170517
Lead Channel Impedance Value: 342 Ohm
Lead Channel Impedance Value: 342 Ohm
Lead Channel Impedance Value: 380 Ohm
Lead Channel Impedance Value: 418 Ohm
Lead Channel Pacing Threshold Amplitude: 0.625 V
Lead Channel Pacing Threshold Amplitude: 0.875 V
Lead Channel Pacing Threshold Pulse Width: 0.4 ms
Lead Channel Pacing Threshold Pulse Width: 0.4 ms
Lead Channel Sensing Intrinsic Amplitude: 11.625 mV
Lead Channel Sensing Intrinsic Amplitude: 11.625 mV
Lead Channel Sensing Intrinsic Amplitude: 2.25 mV
Lead Channel Sensing Intrinsic Amplitude: 2.25 mV
Lead Channel Setting Pacing Amplitude: 1.5 V
Lead Channel Setting Pacing Amplitude: 2.5 V
Lead Channel Setting Pacing Pulse Width: 0.4 ms
Lead Channel Setting Sensing Sensitivity: 2.8 mV
Zone Setting Status: 755011
Zone Setting Status: 755011

## 2023-07-18 NOTE — Progress Notes (Signed)
 Remote pacemaker transmission.

## 2023-07-18 NOTE — Addendum Note (Signed)
Addended by: Elease Etienne A on: 07/18/2023 04:22 PM   Modules accepted: Orders

## 2023-09-12 ENCOUNTER — Ambulatory Visit: Payer: Medicare Other

## 2023-09-18 ENCOUNTER — Ambulatory Visit (INDEPENDENT_AMBULATORY_CARE_PROVIDER_SITE_OTHER)

## 2023-09-18 DIAGNOSIS — I441 Atrioventricular block, second degree: Secondary | ICD-10-CM | POA: Diagnosis not present

## 2023-09-18 LAB — CUP PACEART REMOTE DEVICE CHECK
Battery Remaining Longevity: 13 mo
Battery Voltage: 2.89 V
Brady Statistic AP VP Percent: 85.7 %
Brady Statistic AP VS Percent: 0 %
Brady Statistic AS VP Percent: 14.3 %
Brady Statistic AS VS Percent: 0 %
Brady Statistic RA Percent Paced: 84.24 %
Brady Statistic RV Percent Paced: 100 %
Date Time Interrogation Session: 20250424082957
Implantable Lead Connection Status: 753985
Implantable Lead Connection Status: 753985
Implantable Lead Implant Date: 20170517
Implantable Lead Implant Date: 20170517
Implantable Lead Location: 753859
Implantable Lead Location: 753860
Implantable Lead Model: 5076
Implantable Lead Model: 5076
Implantable Pulse Generator Implant Date: 20170517
Lead Channel Impedance Value: 361 Ohm
Lead Channel Impedance Value: 361 Ohm
Lead Channel Impedance Value: 380 Ohm
Lead Channel Impedance Value: 437 Ohm
Lead Channel Pacing Threshold Amplitude: 0.625 V
Lead Channel Pacing Threshold Amplitude: 1 V
Lead Channel Pacing Threshold Pulse Width: 0.4 ms
Lead Channel Pacing Threshold Pulse Width: 0.4 ms
Lead Channel Sensing Intrinsic Amplitude: 1.25 mV
Lead Channel Sensing Intrinsic Amplitude: 1.25 mV
Lead Channel Sensing Intrinsic Amplitude: 11.125 mV
Lead Channel Sensing Intrinsic Amplitude: 11.125 mV
Lead Channel Setting Pacing Amplitude: 1.5 V
Lead Channel Setting Pacing Amplitude: 2.5 V
Lead Channel Setting Pacing Pulse Width: 0.4 ms
Lead Channel Setting Sensing Sensitivity: 2.8 mV
Zone Setting Status: 755011
Zone Setting Status: 755011

## 2023-10-29 NOTE — Progress Notes (Signed)
 Remote pacemaker transmission.

## 2023-12-12 ENCOUNTER — Ambulatory Visit: Payer: Medicare Other

## 2023-12-18 ENCOUNTER — Ambulatory Visit

## 2023-12-25 ENCOUNTER — Ambulatory Visit (INDEPENDENT_AMBULATORY_CARE_PROVIDER_SITE_OTHER)

## 2023-12-25 DIAGNOSIS — I441 Atrioventricular block, second degree: Secondary | ICD-10-CM

## 2023-12-26 LAB — CUP PACEART REMOTE DEVICE CHECK
Battery Remaining Longevity: 9 mo
Battery Voltage: 2.88 V
Brady Statistic AP VP Percent: 87.33 %
Brady Statistic AP VS Percent: 0 %
Brady Statistic AS VP Percent: 12.67 %
Brady Statistic AS VS Percent: 0 %
Brady Statistic RA Percent Paced: 86.37 %
Brady Statistic RV Percent Paced: 100 %
Date Time Interrogation Session: 20250731134642
Implantable Lead Connection Status: 753985
Implantable Lead Connection Status: 753985
Implantable Lead Implant Date: 20170517
Implantable Lead Implant Date: 20170517
Implantable Lead Location: 753859
Implantable Lead Location: 753860
Implantable Lead Model: 5076
Implantable Lead Model: 5076
Implantable Pulse Generator Implant Date: 20170517
Lead Channel Impedance Value: 342 Ohm
Lead Channel Impedance Value: 361 Ohm
Lead Channel Impedance Value: 380 Ohm
Lead Channel Impedance Value: 456 Ohm
Lead Channel Pacing Threshold Amplitude: 0.625 V
Lead Channel Pacing Threshold Amplitude: 0.75 V
Lead Channel Pacing Threshold Pulse Width: 0.4 ms
Lead Channel Pacing Threshold Pulse Width: 0.4 ms
Lead Channel Sensing Intrinsic Amplitude: 2.125 mV
Lead Channel Sensing Intrinsic Amplitude: 2.125 mV
Lead Channel Sensing Intrinsic Amplitude: 25.125 mV
Lead Channel Sensing Intrinsic Amplitude: 25.125 mV
Lead Channel Setting Pacing Amplitude: 1.5 V
Lead Channel Setting Pacing Amplitude: 2.5 V
Lead Channel Setting Pacing Pulse Width: 0.4 ms
Lead Channel Setting Sensing Sensitivity: 2.8 mV
Zone Setting Status: 755011
Zone Setting Status: 755011

## 2023-12-27 ENCOUNTER — Ambulatory Visit: Payer: Self-pay | Admitting: Cardiology

## 2024-02-25 NOTE — Progress Notes (Signed)
 Remote PPM Transmission

## 2024-03-12 ENCOUNTER — Ambulatory Visit: Payer: Medicare Other

## 2024-03-18 ENCOUNTER — Ambulatory Visit

## 2024-03-25 ENCOUNTER — Ambulatory Visit (INDEPENDENT_AMBULATORY_CARE_PROVIDER_SITE_OTHER)

## 2024-03-25 DIAGNOSIS — I441 Atrioventricular block, second degree: Secondary | ICD-10-CM | POA: Diagnosis not present

## 2024-03-30 ENCOUNTER — Ambulatory Visit: Payer: Self-pay | Admitting: Cardiology

## 2024-03-30 LAB — CUP PACEART REMOTE DEVICE CHECK
Battery Remaining Longevity: 6 mo
Battery Voltage: 2.86 V
Brady Statistic AP VP Percent: 90.64 %
Brady Statistic AP VS Percent: 0 %
Brady Statistic AS VP Percent: 9.36 %
Brady Statistic AS VS Percent: 0 %
Brady Statistic RA Percent Paced: 88.91 %
Brady Statistic RV Percent Paced: 99.99 %
Date Time Interrogation Session: 20251103150050
Implantable Lead Connection Status: 753985
Implantable Lead Connection Status: 753985
Implantable Lead Implant Date: 20170517
Implantable Lead Implant Date: 20170517
Implantable Lead Location: 753859
Implantable Lead Location: 753860
Implantable Lead Model: 5076
Implantable Lead Model: 5076
Implantable Pulse Generator Implant Date: 20170517
Lead Channel Impedance Value: 361 Ohm
Lead Channel Impedance Value: 380 Ohm
Lead Channel Impedance Value: 399 Ohm
Lead Channel Impedance Value: 456 Ohm
Lead Channel Pacing Threshold Amplitude: 0.625 V
Lead Channel Pacing Threshold Amplitude: 0.75 V
Lead Channel Pacing Threshold Pulse Width: 0.4 ms
Lead Channel Pacing Threshold Pulse Width: 0.4 ms
Lead Channel Sensing Intrinsic Amplitude: 13.5 mV
Lead Channel Sensing Intrinsic Amplitude: 13.5 mV
Lead Channel Sensing Intrinsic Amplitude: 2 mV
Lead Channel Sensing Intrinsic Amplitude: 2.375 mV
Lead Channel Setting Pacing Amplitude: 1.5 V
Lead Channel Setting Pacing Amplitude: 2.5 V
Lead Channel Setting Pacing Pulse Width: 0.4 ms
Lead Channel Setting Sensing Sensitivity: 2.8 mV
Zone Setting Status: 755011
Zone Setting Status: 755011

## 2024-03-31 NOTE — Progress Notes (Signed)
 Remote PPM Transmission

## 2024-04-30 ENCOUNTER — Ambulatory Visit: Attending: Cardiology

## 2024-05-31 ENCOUNTER — Ambulatory Visit: Attending: Cardiology

## 2024-06-17 ENCOUNTER — Ambulatory Visit

## 2024-06-24 ENCOUNTER — Ambulatory Visit

## 2024-07-01 ENCOUNTER — Ambulatory Visit

## 2024-07-01 LAB — CUP PACEART REMOTE DEVICE CHECK
Battery Remaining Longevity: 3 mo
Battery Voltage: 2.85 V
Brady Statistic AP VP Percent: 96.44 %
Brady Statistic AP VS Percent: 0 %
Brady Statistic AS VP Percent: 3.56 %
Brady Statistic AS VS Percent: 0 %
Brady Statistic RA Percent Paced: 95.55 %
Brady Statistic RV Percent Paced: 100 %
Date Time Interrogation Session: 20260202133647
Implantable Lead Connection Status: 753985
Implantable Lead Connection Status: 753985
Implantable Lead Implant Date: 20170517
Implantable Lead Implant Date: 20170517
Implantable Lead Location: 753859
Implantable Lead Location: 753860
Implantable Lead Model: 5076
Implantable Lead Model: 5076
Implantable Pulse Generator Implant Date: 20170517
Lead Channel Impedance Value: 342 Ohm
Lead Channel Impedance Value: 361 Ohm
Lead Channel Impedance Value: 380 Ohm
Lead Channel Impedance Value: 456 Ohm
Lead Channel Pacing Threshold Amplitude: 0.75 V
Lead Channel Pacing Threshold Amplitude: 0.75 V
Lead Channel Pacing Threshold Pulse Width: 0.4 ms
Lead Channel Pacing Threshold Pulse Width: 0.4 ms
Lead Channel Sensing Intrinsic Amplitude: 13.5 mV
Lead Channel Sensing Intrinsic Amplitude: 13.5 mV
Lead Channel Sensing Intrinsic Amplitude: 2 mV
Lead Channel Sensing Intrinsic Amplitude: 2 mV
Lead Channel Setting Pacing Amplitude: 1.5 V
Lead Channel Setting Pacing Amplitude: 2.5 V
Lead Channel Setting Pacing Pulse Width: 0.4 ms
Lead Channel Setting Sensing Sensitivity: 2.8 mV
Zone Setting Status: 755011
Zone Setting Status: 755011

## 2024-08-01 ENCOUNTER — Ambulatory Visit

## 2024-09-01 ENCOUNTER — Ambulatory Visit

## 2024-09-16 ENCOUNTER — Ambulatory Visit

## 2024-09-23 ENCOUNTER — Ambulatory Visit

## 2024-09-30 ENCOUNTER — Ambulatory Visit

## 2024-12-16 ENCOUNTER — Ambulatory Visit

## 2024-12-23 ENCOUNTER — Ambulatory Visit

## 2025-03-17 ENCOUNTER — Ambulatory Visit
